# Patient Record
Sex: Male | Born: 1950 | Race: White | Hispanic: No | Marital: Married | Smoking: Never smoker
Health system: Southern US, Community
[De-identification: ages and names within clinical notes are randomized; demographics above are authoritative.]

## PROBLEM LIST (undated history)

## (undated) DIAGNOSIS — E039 Hypothyroidism, unspecified: Secondary | ICD-10-CM

## (undated) DIAGNOSIS — E785 Hyperlipidemia, unspecified: Secondary | ICD-10-CM

## (undated) DIAGNOSIS — C801 Malignant (primary) neoplasm, unspecified: Secondary | ICD-10-CM

## (undated) DIAGNOSIS — I498 Other specified cardiac arrhythmias: Secondary | ICD-10-CM

## (undated) HISTORY — DX: Hyperlipidemia, unspecified: E78.5

## (undated) HISTORY — DX: Other specified cardiac arrhythmias: I49.8

## (undated) HISTORY — PX: KNEE SURGERY: SHX244

## (undated) HISTORY — DX: Hypothyroidism, unspecified: E03.9

## (undated) HISTORY — DX: Malignant (primary) neoplasm, unspecified: C80.1

## (undated) HISTORY — PX: COLONOSCOPY: SHX174

---

## 1998-04-10 ENCOUNTER — Ambulatory Visit (HOSPITAL_COMMUNITY): Admission: RE | Admit: 1998-04-10 | Discharge: 1998-04-10 | Payer: Self-pay | Admitting: Internal Medicine

## 1998-04-10 ENCOUNTER — Encounter: Payer: Self-pay | Admitting: Internal Medicine

## 2000-04-04 HISTORY — PX: KNEE ARTHROSCOPY: SUR90

## 2004-06-14 ENCOUNTER — Ambulatory Visit: Payer: Self-pay | Admitting: Internal Medicine

## 2004-06-24 ENCOUNTER — Ambulatory Visit: Payer: Self-pay | Admitting: Internal Medicine

## 2004-07-12 ENCOUNTER — Ambulatory Visit: Payer: Self-pay | Admitting: Internal Medicine

## 2004-07-12 ENCOUNTER — Encounter (INDEPENDENT_AMBULATORY_CARE_PROVIDER_SITE_OTHER): Payer: Self-pay | Admitting: *Deleted

## 2004-07-12 LAB — CONVERTED CEMR LAB

## 2005-04-13 ENCOUNTER — Ambulatory Visit: Payer: Self-pay | Admitting: Internal Medicine

## 2005-06-13 ENCOUNTER — Ambulatory Visit: Payer: Self-pay | Admitting: Internal Medicine

## 2005-07-20 ENCOUNTER — Ambulatory Visit: Payer: Self-pay | Admitting: Internal Medicine

## 2005-10-07 ENCOUNTER — Ambulatory Visit: Payer: Self-pay | Admitting: Internal Medicine

## 2005-10-10 ENCOUNTER — Ambulatory Visit: Payer: Self-pay | Admitting: Internal Medicine

## 2006-01-10 ENCOUNTER — Ambulatory Visit: Payer: Self-pay | Admitting: Internal Medicine

## 2006-07-11 ENCOUNTER — Ambulatory Visit: Payer: Self-pay | Admitting: Internal Medicine

## 2006-07-11 LAB — CONVERTED CEMR LAB
AST: 21 units/L (ref 0–37)
Albumin: 3.9 g/dL (ref 3.5–5.2)
Basophils Absolute: 0 10*3/uL (ref 0.0–0.1)
Chloride: 108 meq/L (ref 96–112)
Creatinine, Ser: 1.3 mg/dL (ref 0.4–1.5)
Eosinophils Relative: 3.5 % (ref 0.0–5.0)
HCT: 43.6 % (ref 39.0–52.0)
MCHC: 34.3 g/dL (ref 30.0–36.0)
Neutrophils Relative %: 53.4 % (ref 43.0–77.0)
PSA: 3.6 ng/mL (ref 0.10–4.00)
RBC: 4.54 M/uL (ref 4.22–5.81)
RDW: 12.2 % (ref 11.5–14.6)
Sodium: 143 meq/L (ref 135–145)
Total Bilirubin: 1 mg/dL (ref 0.3–1.2)
Total CHOL/HDL Ratio: 4
Triglycerides: 108 mg/dL (ref 0–149)
WBC: 5.2 10*3/uL (ref 4.5–10.5)

## 2006-07-19 ENCOUNTER — Ambulatory Visit: Payer: Self-pay | Admitting: Internal Medicine

## 2006-09-27 ENCOUNTER — Encounter: Payer: Self-pay | Admitting: Internal Medicine

## 2006-10-31 ENCOUNTER — Ambulatory Visit: Payer: Self-pay | Admitting: Internal Medicine

## 2006-11-12 LAB — CONVERTED CEMR LAB
HDL: 55.1 mg/dL (ref >39.0)
Hgb A1c MFr Bld: 5.6 % (ref 4.6–6.0)
Total CHOL/HDL Ratio: 4.2
Triglycerides: 61 mg/dL (ref 0–149)
VLDL: 12 mg/dL (ref 0–40)

## 2006-11-13 ENCOUNTER — Encounter (INDEPENDENT_AMBULATORY_CARE_PROVIDER_SITE_OTHER): Payer: Self-pay | Admitting: *Deleted

## 2007-05-30 ENCOUNTER — Telehealth (INDEPENDENT_AMBULATORY_CARE_PROVIDER_SITE_OTHER): Payer: Self-pay | Admitting: *Deleted

## 2007-07-19 ENCOUNTER — Encounter: Payer: Self-pay | Admitting: Internal Medicine

## 2007-07-27 ENCOUNTER — Ambulatory Visit: Payer: Self-pay | Admitting: Internal Medicine

## 2007-07-31 ENCOUNTER — Encounter: Payer: Self-pay | Admitting: Internal Medicine

## 2007-08-01 ENCOUNTER — Ambulatory Visit: Payer: Self-pay | Admitting: Gastroenterology

## 2007-08-07 ENCOUNTER — Encounter (INDEPENDENT_AMBULATORY_CARE_PROVIDER_SITE_OTHER): Payer: Self-pay | Admitting: *Deleted

## 2007-08-07 DIAGNOSIS — Z9889 Other specified postprocedural states: Secondary | ICD-10-CM

## 2007-08-10 ENCOUNTER — Ambulatory Visit: Payer: Self-pay | Admitting: Internal Medicine

## 2007-08-10 DIAGNOSIS — E785 Hyperlipidemia, unspecified: Secondary | ICD-10-CM

## 2007-08-10 HISTORY — DX: Hyperlipidemia, unspecified: E78.5

## 2007-08-13 ENCOUNTER — Ambulatory Visit: Payer: Self-pay | Admitting: Gastroenterology

## 2007-11-08 ENCOUNTER — Ambulatory Visit: Payer: Self-pay | Admitting: Internal Medicine

## 2007-11-08 LAB — CONVERTED CEMR LAB
Albumin: 4.1 g/dL (ref 3.5–5.2)
Cholesterol: 172 mg/dL (ref 0–200)
HDL: 57.8 mg/dL (ref >39.0)
LDL Cholesterol: 99 mg/dL (ref 0–99)
Total Protein: 6.7 g/dL (ref 6.0–8.3)
Triglycerides: 77 mg/dL (ref 0–149)
VLDL: 15 mg/dL (ref 0–40)

## 2007-11-15 ENCOUNTER — Ambulatory Visit: Payer: Self-pay | Admitting: Internal Medicine

## 2007-11-15 LAB — CONVERTED CEMR LAB
Cholesterol, target level: 200 mg/dL
HDL goal, serum: 50 mg/dL
LDL Goal: 100 mg/dL

## 2007-12-03 ENCOUNTER — Ambulatory Visit: Payer: Self-pay | Admitting: Internal Medicine

## 2007-12-04 ENCOUNTER — Telehealth (INDEPENDENT_AMBULATORY_CARE_PROVIDER_SITE_OTHER): Payer: Self-pay | Admitting: *Deleted

## 2007-12-04 ENCOUNTER — Encounter (INDEPENDENT_AMBULATORY_CARE_PROVIDER_SITE_OTHER): Payer: Self-pay | Admitting: *Deleted

## 2008-03-12 ENCOUNTER — Ambulatory Visit: Payer: Self-pay | Admitting: Internal Medicine

## 2008-03-25 ENCOUNTER — Ambulatory Visit: Payer: Self-pay | Admitting: Internal Medicine

## 2008-03-27 LAB — CONVERTED CEMR LAB
Basophils Absolute: 0 10*3/uL (ref 0.0–0.1)
Lymphocytes Relative: 32 % (ref 12.0–46.0)
MCHC: 34 g/dL (ref 30.0–36.0)
Monocytes Absolute: 0.5 10*3/uL (ref 0.1–1.0)
Monocytes Relative: 9.1 % (ref 3.0–12.0)
Platelets: 195 10*3/uL (ref 150–400)
RDW: 12.5 % (ref 11.5–14.6)
Sed Rate: 6 mm/hr (ref 0–16)

## 2008-03-31 ENCOUNTER — Encounter (INDEPENDENT_AMBULATORY_CARE_PROVIDER_SITE_OTHER): Payer: Self-pay | Admitting: *Deleted

## 2008-04-21 ENCOUNTER — Encounter: Payer: Self-pay | Admitting: Internal Medicine

## 2008-04-24 ENCOUNTER — Encounter: Admission: RE | Admit: 2008-04-24 | Discharge: 2008-04-24 | Payer: Self-pay | Admitting: Otolaryngology

## 2008-04-25 ENCOUNTER — Encounter: Payer: Self-pay | Admitting: Internal Medicine

## 2008-04-25 ENCOUNTER — Other Ambulatory Visit: Admission: RE | Admit: 2008-04-25 | Discharge: 2008-04-25 | Payer: Self-pay | Admitting: Otolaryngology

## 2008-05-05 DIAGNOSIS — C801 Malignant (primary) neoplasm, unspecified: Secondary | ICD-10-CM

## 2008-05-05 HISTORY — DX: Malignant (primary) neoplasm, unspecified: C80.1

## 2008-05-05 HISTORY — PX: TONSILLECTOMY: SUR1361

## 2008-05-06 ENCOUNTER — Encounter: Payer: Self-pay | Admitting: Internal Medicine

## 2008-05-06 ENCOUNTER — Other Ambulatory Visit: Admission: RE | Admit: 2008-05-06 | Discharge: 2008-05-06 | Payer: Self-pay | Admitting: Otolaryngology

## 2008-05-07 ENCOUNTER — Telehealth: Payer: Self-pay | Admitting: Internal Medicine

## 2008-05-20 ENCOUNTER — Telehealth: Payer: Self-pay | Admitting: Internal Medicine

## 2008-05-23 ENCOUNTER — Encounter: Payer: Self-pay | Admitting: Internal Medicine

## 2008-05-26 ENCOUNTER — Ambulatory Visit: Admission: RE | Admit: 2008-05-26 | Discharge: 2008-08-24 | Payer: Self-pay | Admitting: Radiation Oncology

## 2008-05-27 ENCOUNTER — Encounter: Payer: Self-pay | Admitting: Internal Medicine

## 2008-05-27 ENCOUNTER — Telehealth: Payer: Self-pay | Admitting: Internal Medicine

## 2008-05-28 ENCOUNTER — Ambulatory Visit: Payer: Self-pay | Admitting: Oncology

## 2008-05-29 ENCOUNTER — Encounter: Admission: AD | Admit: 2008-05-29 | Discharge: 2008-05-29 | Payer: Self-pay | Admitting: Dentistry

## 2008-05-29 ENCOUNTER — Ambulatory Visit: Payer: Self-pay | Admitting: Dentistry

## 2008-06-04 ENCOUNTER — Encounter: Payer: Self-pay | Admitting: Internal Medicine

## 2008-06-04 ENCOUNTER — Ambulatory Visit (HOSPITAL_COMMUNITY): Admission: RE | Admit: 2008-06-04 | Discharge: 2008-06-04 | Payer: Self-pay | Admitting: Radiation Oncology

## 2008-06-04 LAB — CBC WITH DIFFERENTIAL/PLATELET
BASO%: 0.7 % (ref 0.0–2.0)
Basophils Absolute: 0 10*3/uL (ref 0.0–0.1)
HCT: 39 % (ref 38.4–49.9)
HGB: 13.5 g/dL (ref 13.0–17.1)
MONO#: 0.5 10*3/uL (ref 0.1–0.9)
NEUT%: 61.6 % (ref 39.0–75.0)
RDW: 13.4 % (ref 11.0–14.6)
WBC: 4.9 10*3/uL (ref 4.0–10.3)
lymph#: 1.2 10*3/uL (ref 0.9–3.3)

## 2008-06-04 LAB — COMPREHENSIVE METABOLIC PANEL
ALT: 17 U/L (ref 0–53)
Albumin: 4.6 g/dL (ref 3.5–5.2)
BUN: 16 mg/dL (ref 6–23)
CO2: 25 mEq/L (ref 19–32)
Calcium: 9.3 mg/dL (ref 8.4–10.5)
Chloride: 104 mEq/L (ref 96–112)
Creatinine, Ser: 1.14 mg/dL (ref 0.40–1.50)
Potassium: 4.5 mEq/L (ref 3.5–5.3)

## 2008-06-10 ENCOUNTER — Ambulatory Visit (HOSPITAL_COMMUNITY): Admission: RE | Admit: 2008-06-10 | Discharge: 2008-06-10 | Payer: Self-pay | Admitting: Radiation Oncology

## 2008-06-18 ENCOUNTER — Encounter: Payer: Self-pay | Admitting: Internal Medicine

## 2008-06-24 LAB — BASIC METABOLIC PANEL
CO2: 26 mEq/L (ref 19–32)
Chloride: 93 mEq/L — ABNORMAL LOW (ref 96–112)
Creatinine, Ser: 1.44 mg/dL (ref 0.40–1.50)
Potassium: 4.3 mEq/L (ref 3.5–5.3)
Sodium: 131 mEq/L — ABNORMAL LOW (ref 135–145)

## 2008-06-24 LAB — CBC WITH DIFFERENTIAL/PLATELET
BASO%: 0.3 % (ref 0.0–2.0)
EOS%: 1 % (ref 0.0–7.0)
HCT: 45 % (ref 38.4–49.9)
MCH: 32.2 pg (ref 27.2–33.4)
MCHC: 34 g/dL (ref 32.0–36.0)
MONO#: 0.6 10*3/uL (ref 0.1–0.9)
NEUT%: 77.3 % — ABNORMAL HIGH (ref 39.0–75.0)
RDW: 12.6 % (ref 11.0–14.6)
WBC: 6.4 10*3/uL (ref 4.0–10.3)
lymph#: 0.8 10*3/uL — ABNORMAL LOW (ref 0.9–3.3)

## 2008-06-26 ENCOUNTER — Ambulatory Visit (HOSPITAL_COMMUNITY): Admission: RE | Admit: 2008-06-26 | Discharge: 2008-06-26 | Payer: Self-pay | Admitting: Oncology

## 2008-07-01 LAB — COMPREHENSIVE METABOLIC PANEL
ALT: 18 U/L (ref 0–53)
Alkaline Phosphatase: 60 U/L (ref 39–117)
CO2: 26 mEq/L (ref 19–32)
Creatinine, Ser: 1.33 mg/dL (ref 0.40–1.50)
Glucose, Bld: 88 mg/dL (ref 70–99)
Total Bilirubin: 0.7 mg/dL (ref 0.3–1.2)

## 2008-07-01 LAB — CBC WITH DIFFERENTIAL/PLATELET
BASO%: 0.5 % (ref 0.0–2.0)
HCT: 39.5 % (ref 38.4–49.9)
LYMPH%: 9.4 % — ABNORMAL LOW (ref 14.0–49.0)
MCHC: 34.9 g/dL (ref 32.0–36.0)
MCV: 95.1 fL (ref 79.3–98.0)
MONO#: 0.5 10*3/uL (ref 0.1–0.9)
MONO%: 9 % (ref 0.0–14.0)
NEUT%: 79.8 % — ABNORMAL HIGH (ref 39.0–75.0)
Platelets: 171 10*3/uL (ref 140–400)
WBC: 5.6 10*3/uL (ref 4.0–10.3)

## 2008-07-03 ENCOUNTER — Ambulatory Visit: Payer: Self-pay | Admitting: Oncology

## 2008-07-08 LAB — CBC WITH DIFFERENTIAL/PLATELET
BASO%: 0.6 % (ref 0.0–2.0)
LYMPH%: 14 % (ref 14.0–49.0)
MCH: 33 pg (ref 27.2–33.4)
MCHC: 35 g/dL (ref 32.0–36.0)
MCV: 94.4 fL (ref 79.3–98.0)
MONO%: 17.7 % — ABNORMAL HIGH (ref 0.0–14.0)
Platelets: 155 10*3/uL (ref 140–400)
RBC: 3.92 10*6/uL — ABNORMAL LOW (ref 4.20–5.82)

## 2008-07-08 LAB — COMPREHENSIVE METABOLIC PANEL
Albumin: 3.8 g/dL (ref 3.5–5.2)
Alkaline Phosphatase: 56 U/L (ref 39–117)
BUN: 17 mg/dL (ref 6–23)
Calcium: 9.2 mg/dL (ref 8.4–10.5)
Glucose, Bld: 89 mg/dL (ref 70–99)
Potassium: 4.3 mEq/L (ref 3.5–5.3)

## 2008-07-16 LAB — COMPREHENSIVE METABOLIC PANEL
Albumin: 4.3 g/dL (ref 3.5–5.2)
BUN: 23 mg/dL (ref 6–23)
CO2: 27 mEq/L (ref 19–32)
Calcium: 9.2 mg/dL (ref 8.4–10.5)
Chloride: 99 mEq/L (ref 96–112)
Glucose, Bld: 94 mg/dL (ref 70–99)
Potassium: 4.1 mEq/L (ref 3.5–5.3)
Total Protein: 6.8 g/dL (ref 6.0–8.3)

## 2008-07-16 LAB — CBC WITH DIFFERENTIAL/PLATELET
Basophils Absolute: 0 10*3/uL (ref 0.0–0.1)
Eosinophils Absolute: 0.1 10*3/uL (ref 0.0–0.5)
HGB: 13.4 g/dL (ref 13.0–17.1)
MCV: 94.2 fL (ref 79.3–98.0)
MONO#: 0.6 10*3/uL (ref 0.1–0.9)
NEUT#: 10.2 10*3/uL — ABNORMAL HIGH (ref 1.5–6.5)
RDW: 12.8 % (ref 11.0–14.6)
WBC: 11.3 10*3/uL — ABNORMAL HIGH (ref 4.0–10.3)
lymph#: 0.4 10*3/uL — ABNORMAL LOW (ref 0.9–3.3)

## 2008-07-23 LAB — COMPREHENSIVE METABOLIC PANEL
AST: 15 U/L (ref 0–37)
Albumin: 4.3 g/dL (ref 3.5–5.2)
BUN: 23 mg/dL (ref 6–23)
Calcium: 9.2 mg/dL (ref 8.4–10.5)
Chloride: 104 mEq/L (ref 96–112)
Potassium: 5.1 mEq/L (ref 3.5–5.3)
Sodium: 136 mEq/L (ref 135–145)
Total Protein: 6.9 g/dL (ref 6.0–8.3)

## 2008-07-23 LAB — CBC WITH DIFFERENTIAL/PLATELET
Basophils Absolute: 0 10*3/uL (ref 0.0–0.1)
EOS%: 1.5 % (ref 0.0–7.0)
Eosinophils Absolute: 0.1 10*3/uL (ref 0.0–0.5)
HGB: 12.5 g/dL — ABNORMAL LOW (ref 13.0–17.1)
MCH: 32.8 pg (ref 27.2–33.4)
NEUT#: 5.9 10*3/uL (ref 1.5–6.5)
RBC: 3.81 10*6/uL — ABNORMAL LOW (ref 4.20–5.82)
RDW: 13.1 % (ref 11.0–14.6)
lymph#: 0.4 10*3/uL — ABNORMAL LOW (ref 0.9–3.3)

## 2008-07-30 LAB — CBC WITH DIFFERENTIAL/PLATELET
BASO%: 0.1 % (ref 0.0–2.0)
EOS%: 0.8 % (ref 0.0–7.0)
MCHC: 34.5 g/dL (ref 32.0–36.0)
MONO#: 0.5 10*3/uL (ref 0.1–0.9)
RBC: 3.82 10*6/uL — ABNORMAL LOW (ref 4.20–5.82)
RDW: 13.3 % (ref 11.0–14.6)
WBC: 7.1 10*3/uL (ref 4.0–10.3)
lymph#: 0.4 10*3/uL — ABNORMAL LOW (ref 0.9–3.3)

## 2008-07-30 LAB — COMPREHENSIVE METABOLIC PANEL
ALT: 12 U/L (ref 0–53)
AST: 15 U/L (ref 0–37)
Alkaline Phosphatase: 73 U/L (ref 39–117)
BUN: 24 mg/dL — ABNORMAL HIGH (ref 6–23)
Calcium: 9.6 mg/dL (ref 8.4–10.5)
Chloride: 104 mEq/L (ref 96–112)
Creatinine, Ser: 1.28 mg/dL (ref 0.40–1.50)
Total Bilirubin: 0.3 mg/dL (ref 0.3–1.2)

## 2008-08-25 ENCOUNTER — Ambulatory Visit: Payer: Self-pay | Admitting: Oncology

## 2008-08-27 LAB — CBC WITH DIFFERENTIAL/PLATELET
BASO%: 0.7 % (ref 0.0–2.0)
LYMPH%: 10.6 % — ABNORMAL LOW (ref 14.0–49.0)
MCHC: 35.1 g/dL (ref 32.0–36.0)
MONO#: 0.5 10*3/uL (ref 0.1–0.9)
MONO%: 12.4 % (ref 0.0–14.0)
Platelets: 161 10*3/uL (ref 140–400)
RBC: 3.32 10*6/uL — ABNORMAL LOW (ref 4.20–5.82)
WBC: 4 10*3/uL (ref 4.0–10.3)

## 2008-08-27 LAB — COMPREHENSIVE METABOLIC PANEL
ALT: 14 U/L (ref 0–53)
AST: 15 U/L (ref 0–37)
Alkaline Phosphatase: 58 U/L (ref 39–117)
CO2: 26 mEq/L (ref 19–32)
Sodium: 141 mEq/L (ref 135–145)
Total Bilirubin: 0.4 mg/dL (ref 0.3–1.2)
Total Protein: 6.3 g/dL (ref 6.0–8.3)

## 2008-09-16 ENCOUNTER — Ambulatory Visit: Payer: Self-pay | Admitting: Dentistry

## 2008-09-22 ENCOUNTER — Ambulatory Visit (HOSPITAL_COMMUNITY): Admission: RE | Admit: 2008-09-22 | Discharge: 2008-09-22 | Payer: Self-pay | Admitting: Radiation Oncology

## 2008-09-23 ENCOUNTER — Encounter: Payer: Self-pay | Admitting: Internal Medicine

## 2008-10-31 ENCOUNTER — Ambulatory Visit (HOSPITAL_COMMUNITY): Admission: RE | Admit: 2008-10-31 | Discharge: 2008-10-31 | Payer: Self-pay | Admitting: Oncology

## 2008-11-01 ENCOUNTER — Ambulatory Visit (HOSPITAL_COMMUNITY): Admission: RE | Admit: 2008-11-01 | Discharge: 2008-11-01 | Payer: Self-pay | Admitting: Urology

## 2008-11-03 ENCOUNTER — Ambulatory Visit: Payer: Self-pay | Admitting: Internal Medicine

## 2008-11-03 ENCOUNTER — Ambulatory Visit: Payer: Self-pay | Admitting: Oncology

## 2008-11-05 LAB — COMPREHENSIVE METABOLIC PANEL
ALT: 28 U/L (ref 0–53)
CO2: 28 mEq/L (ref 19–32)
Chloride: 104 mEq/L (ref 96–112)
Potassium: 4.4 mEq/L (ref 3.5–5.3)
Sodium: 139 mEq/L (ref 135–145)
Total Bilirubin: 0.6 mg/dL (ref 0.3–1.2)
Total Protein: 6.4 g/dL (ref 6.0–8.3)

## 2008-11-05 LAB — CBC WITH DIFFERENTIAL/PLATELET
BASO%: 1.5 % (ref 0.0–2.0)
LYMPH%: 17.3 % (ref 14.0–49.0)
MCHC: 35.5 g/dL (ref 32.0–36.0)
MONO#: 0.4 10*3/uL (ref 0.1–0.9)
RBC: 3.87 10*6/uL — ABNORMAL LOW (ref 4.20–5.82)
RDW: 12 % (ref 11.0–14.6)
WBC: 2.6 10*3/uL — ABNORMAL LOW (ref 4.0–10.3)
lymph#: 0.5 10*3/uL — ABNORMAL LOW (ref 0.9–3.3)

## 2009-01-06 ENCOUNTER — Encounter: Payer: Self-pay | Admitting: Internal Medicine

## 2009-01-21 ENCOUNTER — Encounter: Payer: Self-pay | Admitting: Family Medicine

## 2009-01-21 ENCOUNTER — Ambulatory Visit: Payer: Self-pay | Admitting: Family Medicine

## 2009-03-02 ENCOUNTER — Ambulatory Visit: Payer: Self-pay | Admitting: Oncology

## 2009-03-04 LAB — CBC WITH DIFFERENTIAL/PLATELET
BASO%: 0.6 % (ref 0.0–2.0)
EOS%: 2.8 % (ref 0.0–7.0)
HCT: 39.3 % (ref 38.4–49.9)
LYMPH%: 16.1 % (ref 14.0–49.0)
MCH: 34.7 pg — ABNORMAL HIGH (ref 27.2–33.4)
MCHC: 34.2 g/dL (ref 32.0–36.0)
MCV: 101.6 fL — ABNORMAL HIGH (ref 79.3–98.0)
NEUT%: 70.2 % (ref 39.0–75.0)
Platelets: 171 10*3/uL (ref 140–400)

## 2009-03-04 LAB — BASIC METABOLIC PANEL
BUN: 22 mg/dL (ref 6–23)
Calcium: 9.2 mg/dL (ref 8.4–10.5)
Creatinine, Ser: 1.33 mg/dL (ref 0.40–1.50)

## 2009-03-06 ENCOUNTER — Encounter: Payer: Self-pay | Admitting: Internal Medicine

## 2009-03-11 ENCOUNTER — Telehealth: Payer: Self-pay | Admitting: Internal Medicine

## 2009-03-23 ENCOUNTER — Ambulatory Visit: Payer: Self-pay | Admitting: Internal Medicine

## 2009-03-23 DIAGNOSIS — E039 Hypothyroidism, unspecified: Secondary | ICD-10-CM

## 2009-03-23 HISTORY — DX: Hypothyroidism, unspecified: E03.9

## 2009-04-22 ENCOUNTER — Encounter: Payer: Self-pay | Admitting: Internal Medicine

## 2009-04-23 ENCOUNTER — Ambulatory Visit (HOSPITAL_COMMUNITY): Admission: RE | Admit: 2009-04-23 | Discharge: 2009-04-23 | Payer: Self-pay | Admitting: Oncology

## 2009-04-24 ENCOUNTER — Telehealth (INDEPENDENT_AMBULATORY_CARE_PROVIDER_SITE_OTHER): Payer: Self-pay | Admitting: *Deleted

## 2009-04-27 ENCOUNTER — Encounter: Payer: Self-pay | Admitting: Internal Medicine

## 2009-04-29 DIAGNOSIS — I498 Other specified cardiac arrhythmias: Secondary | ICD-10-CM

## 2009-04-29 HISTORY — DX: Other specified cardiac arrhythmias: I49.8

## 2009-04-30 ENCOUNTER — Ambulatory Visit: Payer: Self-pay | Admitting: Internal Medicine

## 2009-05-09 ENCOUNTER — Ambulatory Visit (HOSPITAL_COMMUNITY): Admission: RE | Admit: 2009-05-09 | Discharge: 2009-05-09 | Payer: Self-pay | Admitting: Urology

## 2009-05-11 ENCOUNTER — Encounter: Payer: Self-pay | Admitting: Internal Medicine

## 2009-05-12 ENCOUNTER — Encounter: Payer: Self-pay | Admitting: Internal Medicine

## 2009-05-19 ENCOUNTER — Telehealth: Payer: Self-pay | Admitting: Internal Medicine

## 2009-05-22 ENCOUNTER — Ambulatory Visit: Payer: Self-pay | Admitting: Internal Medicine

## 2009-05-22 LAB — CONVERTED CEMR LAB
BUN: 19 mg/dL (ref 6–23)
Basophils Absolute: 0 10*3/uL (ref 0.0–0.1)
Calcium: 8.4 mg/dL (ref 8.4–10.5)
Chloride: 102 meq/L (ref 96–112)
Creatinine, Ser: 1.28 mg/dL (ref 0.40–1.50)
Eosinophils Relative: 3 % (ref 0–5)
HCT: 35.7 % — ABNORMAL LOW (ref 39.0–52.0)
Hemoglobin: 12.6 g/dL — ABNORMAL LOW (ref 13.0–17.0)
INR: 0.98 (ref <1.50)
Lymphocytes Relative: 17 % (ref 12–46)
Lymphs Abs: 0.6 10*3/uL — ABNORMAL LOW (ref 0.7–4.0)
Neutro Abs: 2.1 10*3/uL (ref 1.7–7.7)
Platelets: 139 10*3/uL — ABNORMAL LOW (ref 150–400)
Prothrombin Time: 12.9 s (ref 11.6–15.2)
WBC: 3.3 10*3/uL — ABNORMAL LOW (ref 4.0–10.5)
aPTT: 32 s (ref 24–37)

## 2009-05-25 ENCOUNTER — Ambulatory Visit: Payer: Self-pay | Admitting: Internal Medicine

## 2009-05-26 ENCOUNTER — Telehealth (INDEPENDENT_AMBULATORY_CARE_PROVIDER_SITE_OTHER): Payer: Self-pay | Admitting: *Deleted

## 2009-05-26 ENCOUNTER — Telehealth: Payer: Self-pay | Admitting: Internal Medicine

## 2009-05-27 ENCOUNTER — Telehealth (INDEPENDENT_AMBULATORY_CARE_PROVIDER_SITE_OTHER): Payer: Self-pay | Admitting: *Deleted

## 2009-05-27 ENCOUNTER — Telehealth: Payer: Self-pay | Admitting: Internal Medicine

## 2009-05-27 LAB — CONVERTED CEMR LAB
Basophils Relative: 0 % (ref 0.0–3.0)
Eosinophils Absolute: 0 10*3/uL (ref 0.0–0.7)
Eosinophils Relative: 0.3 % (ref 0.0–5.0)
Folate: 5.9 ng/mL
Hemoglobin: 13.3 g/dL (ref 13.0–17.0)
Iron: 7 ug/dL — ABNORMAL LOW (ref 42–165)
Lymphocytes Relative: 6.2 % — ABNORMAL LOW (ref 12.0–46.0)
MCHC: 32.8 g/dL (ref 30.0–36.0)
Monocytes Relative: 4 % (ref 3.0–12.0)
Neutro Abs: 10.8 10*3/uL — ABNORMAL HIGH (ref 1.4–7.7)
Neutrophils Relative %: 89.5 % — ABNORMAL HIGH (ref 43.0–77.0)
RBC: 3.99 M/uL — ABNORMAL LOW (ref 4.22–5.81)
Saturation Ratios: 2.7 % — ABNORMAL LOW (ref 20.0–50.0)
Transferrin: 182.6 mg/dL — ABNORMAL LOW (ref 212.0–360.0)
WBC: 12.1 10*3/uL — ABNORMAL HIGH (ref 4.5–10.5)

## 2009-06-02 ENCOUNTER — Telehealth (INDEPENDENT_AMBULATORY_CARE_PROVIDER_SITE_OTHER): Payer: Self-pay | Admitting: *Deleted

## 2009-06-02 HISTORY — PX: OTHER SURGICAL HISTORY: SHX169

## 2009-06-03 ENCOUNTER — Telehealth (INDEPENDENT_AMBULATORY_CARE_PROVIDER_SITE_OTHER): Payer: Self-pay | Admitting: *Deleted

## 2009-06-08 ENCOUNTER — Ambulatory Visit: Payer: Self-pay | Admitting: Internal Medicine

## 2009-06-08 LAB — CONVERTED CEMR LAB
Calcium: 8.9 mg/dL (ref 8.4–10.5)
GFR calc non Af Amer: 60.51 mL/min (ref >60)
Glucose, Bld: 85 mg/dL (ref 70–99)
HCT: 35.8 % — ABNORMAL LOW (ref 39.0–52.0)
Hemoglobin: 12.1 g/dL — ABNORMAL LOW (ref 13.0–17.0)
INR: 1.2 — ABNORMAL HIGH (ref 0.8–1.0)
Platelets: 272 10*3/uL (ref 150.0–400.0)
Potassium: 4.4 meq/L (ref 3.5–5.1)
Prothrombin Time: 12.5 s — ABNORMAL HIGH (ref 9.1–11.7)
RDW: 12.4 % (ref 11.5–14.6)
Sodium: 138 meq/L (ref 135–145)
WBC: 3.2 10*3/uL — ABNORMAL LOW (ref 4.5–10.5)

## 2009-06-12 ENCOUNTER — Ambulatory Visit (HOSPITAL_COMMUNITY): Admission: RE | Admit: 2009-06-12 | Discharge: 2009-06-12 | Payer: Self-pay | Admitting: Internal Medicine

## 2009-06-12 ENCOUNTER — Ambulatory Visit: Payer: Self-pay | Admitting: Internal Medicine

## 2009-06-25 ENCOUNTER — Ambulatory Visit: Payer: Self-pay | Admitting: Internal Medicine

## 2009-06-30 LAB — CONVERTED CEMR LAB: TSH: 3.14 microintl units/mL (ref 0.35–5.50)

## 2009-07-02 ENCOUNTER — Telehealth (INDEPENDENT_AMBULATORY_CARE_PROVIDER_SITE_OTHER): Payer: Self-pay | Admitting: *Deleted

## 2009-07-17 ENCOUNTER — Ambulatory Visit: Payer: Self-pay | Admitting: Internal Medicine

## 2009-07-29 ENCOUNTER — Ambulatory Visit: Payer: Self-pay | Admitting: Internal Medicine

## 2009-07-30 ENCOUNTER — Ambulatory Visit: Payer: Self-pay | Admitting: Oncology

## 2009-07-31 ENCOUNTER — Encounter: Payer: Self-pay | Admitting: Internal Medicine

## 2009-07-31 LAB — CBC WITH DIFFERENTIAL/PLATELET
BASO%: 0.9 % (ref 0.0–2.0)
Basophils Absolute: 0 10*3/uL (ref 0.0–0.1)
EOS%: 2.8 % (ref 0.0–7.0)
HCT: 37.5 % — ABNORMAL LOW (ref 38.4–49.9)
HGB: 12.8 g/dL — ABNORMAL LOW (ref 13.0–17.1)
LYMPH%: 26.5 % (ref 14.0–49.0)
MCH: 32.8 pg (ref 27.2–33.4)
MCHC: 34.1 g/dL (ref 32.0–36.0)
MCV: 96.2 fL (ref 79.3–98.0)
MONO%: 13 % (ref 0.0–14.0)
NEUT%: 56.8 % (ref 39.0–75.0)
lymph#: 0.9 10*3/uL (ref 0.9–3.3)

## 2009-07-31 LAB — COMPREHENSIVE METABOLIC PANEL
ALT: 19 U/L (ref 0–53)
AST: 25 U/L (ref 0–37)
Alkaline Phosphatase: 51 U/L (ref 39–117)
BUN: 18 mg/dL (ref 6–23)
Calcium: 9.2 mg/dL (ref 8.4–10.5)
Chloride: 105 mEq/L (ref 96–112)
Creatinine, Ser: 1.35 mg/dL (ref 0.40–1.50)
Total Bilirubin: 0.9 mg/dL (ref 0.3–1.2)

## 2009-08-20 ENCOUNTER — Ambulatory Visit: Payer: Self-pay | Admitting: Internal Medicine

## 2009-08-21 ENCOUNTER — Telehealth (INDEPENDENT_AMBULATORY_CARE_PROVIDER_SITE_OTHER): Payer: Self-pay | Admitting: *Deleted

## 2009-09-14 ENCOUNTER — Encounter: Payer: Self-pay | Admitting: Internal Medicine

## 2009-09-24 ENCOUNTER — Ambulatory Visit: Payer: Self-pay | Admitting: Internal Medicine

## 2009-09-24 DIAGNOSIS — C090 Malignant neoplasm of tonsillar fossa: Secondary | ICD-10-CM | POA: Insufficient documentation

## 2009-09-24 DIAGNOSIS — R6882 Decreased libido: Secondary | ICD-10-CM

## 2009-09-24 DIAGNOSIS — Z85828 Personal history of other malignant neoplasm of skin: Secondary | ICD-10-CM

## 2009-09-24 LAB — CONVERTED CEMR LAB: LDL Goal: 115 mg/dL

## 2009-09-28 LAB — CONVERTED CEMR LAB
ALT: 20 units/L (ref 0–53)
AST: 22 units/L (ref 0–37)
Albumin: 4.5 g/dL (ref 3.5–5.2)
Basophils Relative: 0.6 % (ref 0.0–3.0)
Chloride: 105 meq/L (ref 96–112)
Eosinophils Relative: 4.7 % (ref 0.0–5.0)
GFR calc non Af Amer: 55.95 mL/min (ref >60)
HCT: 42 % (ref 39.0–52.0)
Hemoglobin: 14.6 g/dL (ref 13.0–17.0)
LDL Cholesterol: 115 mg/dL — ABNORMAL HIGH (ref 0–99)
Lymphs Abs: 0.7 10*3/uL (ref 0.7–4.0)
MCV: 99.7 fL (ref 78.0–100.0)
Monocytes Relative: 13.2 % — ABNORMAL HIGH (ref 3.0–12.0)
Neutro Abs: 1.8 10*3/uL (ref 1.4–7.7)
Potassium: 4.8 meq/L (ref 3.5–5.1)
Sodium: 139 meq/L (ref 135–145)
TSH: 4.78 microintl units/mL (ref 0.35–5.50)
Total Protein: 7 g/dL (ref 6.0–8.3)
VLDL: 12 mg/dL (ref 0.0–40.0)
WBC: 3 10*3/uL — ABNORMAL LOW (ref 4.5–10.5)

## 2009-09-30 ENCOUNTER — Ambulatory Visit: Payer: Self-pay | Admitting: Internal Medicine

## 2009-09-30 ENCOUNTER — Encounter (INDEPENDENT_AMBULATORY_CARE_PROVIDER_SITE_OTHER): Payer: Self-pay | Admitting: *Deleted

## 2009-09-30 LAB — CONVERTED CEMR LAB: OCCULT 2: NEGATIVE

## 2009-10-19 ENCOUNTER — Encounter: Payer: Self-pay | Admitting: Internal Medicine

## 2009-11-09 ENCOUNTER — Ambulatory Visit: Payer: Self-pay | Admitting: Oncology

## 2009-11-25 ENCOUNTER — Ambulatory Visit: Payer: Self-pay | Admitting: Internal Medicine

## 2009-11-26 LAB — COMPREHENSIVE METABOLIC PANEL
ALT: 19 U/L (ref 0–53)
AST: 23 U/L (ref 0–37)
Albumin: 4.4 g/dL (ref 3.5–5.2)
CO2: 28 mEq/L (ref 19–32)
Calcium: 9.3 mg/dL (ref 8.4–10.5)
Chloride: 106 mEq/L (ref 96–112)
Creatinine, Ser: 1.41 mg/dL (ref 0.40–1.50)
Potassium: 4.1 mEq/L (ref 3.5–5.3)

## 2009-11-26 LAB — CBC WITH DIFFERENTIAL/PLATELET
BASO%: 1.4 % (ref 0.0–2.0)
Basophils Absolute: 0.1 10*3/uL (ref 0.0–0.1)
EOS%: 2.5 % (ref 0.0–7.0)
HCT: 39.4 % (ref 38.4–49.9)
HGB: 13.9 g/dL (ref 13.0–17.1)
MCH: 33.5 pg — ABNORMAL HIGH (ref 27.2–33.4)
MCHC: 35.3 g/dL (ref 32.0–36.0)
MONO#: 0.5 10*3/uL (ref 0.1–0.9)
NEUT%: 54 % (ref 39.0–75.0)
RDW: 12.5 % (ref 11.0–14.6)
WBC: 3.6 10*3/uL — ABNORMAL LOW (ref 4.0–10.3)
lymph#: 1 10*3/uL (ref 0.9–3.3)

## 2009-11-27 ENCOUNTER — Encounter: Payer: Self-pay | Admitting: Internal Medicine

## 2009-12-08 ENCOUNTER — Telehealth (INDEPENDENT_AMBULATORY_CARE_PROVIDER_SITE_OTHER): Payer: Self-pay | Admitting: *Deleted

## 2010-01-05 ENCOUNTER — Telehealth (INDEPENDENT_AMBULATORY_CARE_PROVIDER_SITE_OTHER): Payer: Self-pay | Admitting: *Deleted

## 2010-02-01 ENCOUNTER — Ambulatory Visit: Payer: Self-pay | Admitting: Oncology

## 2010-02-02 ENCOUNTER — Ambulatory Visit: Payer: Self-pay | Admitting: Internal Medicine

## 2010-02-03 ENCOUNTER — Encounter: Payer: Self-pay | Admitting: Internal Medicine

## 2010-02-03 LAB — COMPREHENSIVE METABOLIC PANEL
Albumin: 4.5 g/dL (ref 3.5–5.2)
Alkaline Phosphatase: 41 U/L (ref 39–117)
BUN: 25 mg/dL — ABNORMAL HIGH (ref 6–23)
Calcium: 9.3 mg/dL (ref 8.4–10.5)
Chloride: 104 mEq/L (ref 96–112)
Creatinine, Ser: 1.41 mg/dL (ref 0.40–1.50)
Glucose, Bld: 84 mg/dL (ref 70–99)
Potassium: 4.4 mEq/L (ref 3.5–5.3)

## 2010-02-03 LAB — CBC WITH DIFFERENTIAL/PLATELET
Basophils Absolute: 0 10*3/uL (ref 0.0–0.1)
EOS%: 2.6 % (ref 0.0–7.0)
Eosinophils Absolute: 0.1 10*3/uL (ref 0.0–0.5)
HCT: 39.6 % (ref 38.4–49.9)
HGB: 13.7 g/dL (ref 13.0–17.1)
MCH: 34.3 pg — ABNORMAL HIGH (ref 27.2–33.4)
MCV: 98.7 fL — ABNORMAL HIGH (ref 79.3–98.0)
MONO%: 12.2 % (ref 0.0–14.0)
NEUT#: 2.2 10*3/uL (ref 1.5–6.5)
NEUT%: 63 % (ref 39.0–75.0)
Platelets: 174 10*3/uL (ref 140–400)
RDW: 12.7 % (ref 11.0–14.6)

## 2010-02-15 LAB — CONVERTED CEMR LAB: TSH: 2.91 microintl units/mL (ref 0.35–5.50)

## 2010-04-06 ENCOUNTER — Ambulatory Visit
Admission: RE | Admit: 2010-04-06 | Discharge: 2010-04-06 | Payer: Self-pay | Source: Home / Self Care | Attending: Internal Medicine | Admitting: Internal Medicine

## 2010-04-06 DIAGNOSIS — J019 Acute sinusitis, unspecified: Secondary | ICD-10-CM | POA: Insufficient documentation

## 2010-04-15 ENCOUNTER — Ambulatory Visit
Admission: RE | Admit: 2010-04-15 | Discharge: 2010-04-15 | Payer: Self-pay | Source: Home / Self Care | Attending: Internal Medicine | Admitting: Internal Medicine

## 2010-04-15 ENCOUNTER — Other Ambulatory Visit: Payer: Self-pay | Admitting: Internal Medicine

## 2010-04-15 DIAGNOSIS — R079 Chest pain, unspecified: Secondary | ICD-10-CM | POA: Insufficient documentation

## 2010-04-15 LAB — CBC WITH DIFFERENTIAL/PLATELET
Basophils Absolute: 0 10*3/uL (ref 0.0–0.1)
Basophils Relative: 0.7 % (ref 0.0–3.0)
Eosinophils Absolute: 0.1 10*3/uL (ref 0.0–0.7)
Eosinophils Relative: 2 % (ref 0.0–5.0)
HCT: 41.8 % (ref 39.0–52.0)
Hemoglobin: 14.3 g/dL (ref 13.0–17.0)
Lymphocytes Relative: 17.7 % (ref 12.0–46.0)
Lymphs Abs: 1 10*3/uL (ref 0.7–4.0)
MCHC: 34.1 g/dL (ref 30.0–36.0)
MCV: 99 fl (ref 78.0–100.0)
Monocytes Absolute: 0.6 10*3/uL (ref 0.1–1.0)
Monocytes Relative: 10.1 % (ref 3.0–12.0)
Neutro Abs: 3.8 10*3/uL (ref 1.4–7.7)
Neutrophils Relative %: 69.5 % (ref 43.0–77.0)
Platelets: 242 10*3/uL (ref 150.0–400.0)
RBC: 4.22 Mil/uL (ref 4.22–5.81)
RDW: 12.4 % (ref 11.5–14.6)
WBC: 5.5 10*3/uL (ref 4.5–10.5)

## 2010-04-15 LAB — SEDIMENTATION RATE: Sed Rate: 10 mm/hr (ref 0–22)

## 2010-04-24 ENCOUNTER — Encounter: Payer: Self-pay | Admitting: Oncology

## 2010-04-25 ENCOUNTER — Encounter: Payer: Self-pay | Admitting: Oncology

## 2010-04-26 ENCOUNTER — Ambulatory Visit
Admission: RE | Admit: 2010-04-26 | Discharge: 2010-04-26 | Payer: Self-pay | Source: Home / Self Care | Attending: Internal Medicine | Admitting: Internal Medicine

## 2010-04-26 DIAGNOSIS — M542 Cervicalgia: Secondary | ICD-10-CM | POA: Insufficient documentation

## 2010-05-04 NOTE — Assessment & Plan Note (Signed)
Summary: eph/jml   Primary Provider:  Alwyn Ren  CC:  eph/ablation.  Marland Kitchen  History of Present Illness: Mr. Bohr is seen following catheter ablation. He has had no recurrent sustained tachycardia palpitations. He has had some triggering sensations.  Current Medications (verified): 1)  Basa 81mg  Qd 2)  Fish Oil 300 Mg Caps (Omega-3 Fatty Acids) .Marland Kitchen.. 1 By Mouth Once Daily 3)  Lipitor 20 Mg  Tabs (Atorvastatin Calcium) .... 1/2 Pill By Mouth M,w,f 4)  Synthroid 75 Mcg Tabs (Levothyroxine Sodium) .Marland Kitchen.. 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 CHF ; no FH lymphoma or NHL  Social History: Last updated: 03/25/2008 Never Smoked Alcohol use-yes OCCASIONAL Married Regular exercise-yes  Vital Signs:  Patient profile:   60 year old male Height:      70 inches Weight:      165 pounds BMI:     23.76 Pulse rate:   47 / minute Pulse rhythm:   regular BP sitting:   140 / 82  (left arm) Cuff size:   large  Vitals Entered By: Judithe Modest CMA (July 17, 2009 12:13 PM)  Physical Exam  General:  The patient was alert and oriented in no acute distress. HEENT Normal.  Neck veins were flat, carotids were brisk.  Lungs were clear.  Heart sounds were regular without murmurs or gallops.  Abdomen was soft with active bowel sounds. There is no clubbing cyanosis or  edema. Skin Warm and dry    Impression & Recommendations:  Problem # 1:  SUPRAVENTRICULAR TACHYCARDIA (ICD-427.89) the patient status post tachycardia ablation. He had no recurrent symptoms. We'll see him again as needed Orders: EKG w/ Interpretation (93000)  Patient Instructions: 1)  Your physician recommends that you schedule a follow-up appointment in: as needed.

## 2010-05-04 NOTE — Progress Notes (Signed)
Summary:  Procedure 05/29/09  Phone Note Outgoing Call   Call placed by: Duncan Dull, RN, BSN,  May 26, 2009 12:51 PM Call placed to: Specialist Summary of Call: Called and left message for Dr. Lodema Pilot RN to call me in ref to Mr. [facility] labs. Duncan Dull, RN, BSN  May 26, 2009 12:51 PM   Follow-up for Phone Call        PT SAW PCP AND LABS WERE ADDRESSED. Follow-up by: Duncan Dull, RN, BSN,  May 27, 2009 10:33 AM

## 2010-05-04 NOTE — Letter (Signed)
Summary: [facility]  [facility]   Imported By: Lanelle Bal 02/16/2010 12:18:46  _____________________________________________________________________  External Attachment:    Type:   Image     Comment:   External Document

## 2010-05-04 NOTE — Progress Notes (Signed)
Summary: night sweat  Phone Note Call from Patient   Caller: Patient Summary of Call: Pt left VM stating that at OV on yesterday he was ask if he had experienced any night sweat and he said no. Pt now states that last night he did experience night sweat and just remember that he did have some episode as well in December. Pt would like to know if this changes anything in regards to his treatment for SINUSITIS, and bronchitis or for his health in general pls advise..................Marland KitchenFelecia Deloach CMA  May 26, 2009 10:33 AM   Follow-up for Phone Call        no , please complete full course of antibiotics. Lab results still pending Follow-up by: Marga Melnick MD,  May 26, 2009 12:10 PM  Additional Follow-up for Phone Call Additional follow up Details #1::        pt aware.................Marland KitchenFelecia Deloach CMA  May 26, 2009 12:16 PM

## 2010-05-04 NOTE — Progress Notes (Signed)
Summary: Triage Call  Phone Note Call from Patient Call back at Home Phone [phone]   Caller: Patient Summary of Call: Patient called to say last night he inhaled gum and it went down the wrong way and he thinks its in his left lung. Patient is in no kind of distress but would like to know if he should be seen or if there is anything he needs to look out for and then be seen.   Dr.Hopper please advise and forward to [facility] (Triage Nurse).Shonna Chock  May 19, 2009 8:23 AM   Follow-up for Phone Call        If he inhaled the gum he should develop cough, fever & possibly wheezing.In event of these CXray would be indicated. Gum itself would not show up on Xray but related PNA would. Usually the epiglottis is 100% effective in preventing aspiration Follow-up by: Marga Melnick MD,  May 19, 2009 8:56 AM  Additional Follow-up for Phone Call Additional follow up Details #1::        pt aware..................Marland KitchenFelecia Deloach CMA  May 19, 2009 9:06 AM

## 2010-05-04 NOTE — Progress Notes (Signed)
Summary: TSH Concerns  Phone Note Call from Patient Call back at Home Phone [phone]   Caller: Patient Summary of Call: pt left VM on chrae VM  that he had recent labs done for thyroid and that we should receive result soon and would like a call with what he needs to do. call pt back left message to call...........Marland KitchenFelecia Deloach CMA  April 24, 2009 10:35 AM   TSH 9.430, goal = 1-3. I agree 75 micrograms once daily with repeat TSH after 8 weeks appropriate(244.9)   Follow-up for Phone Call        Message left on VM informing patient of Dr.Hopper's response in Ref: TSH Follow-up by: Shonna Chock,  April 24, 2009 11:50 AM    New/Updated Medications: SYNTHROID 75 MCG TABS (LEVOTHYROXINE SODIUM) 1 by mouth once daily  Appended Document: TSH Concerns    Phone Note Call from Patient   Caller: Patient Summary of Call: pt called back stating that he is currently on 50 micrograms and wants to know if meds can be broken in half to equal the 75 micrograms. left message to call office to inform pt he would need new rx and to verify pharmacy.................Marland KitchenFelecia Deloach CMA  April 24, 2009 3:07 PM    Follow-up for Phone Call        pt aware rx sent to pharmacy, labs schedule.............Marland KitchenFelecia Deloach CMA  April 24, 2009 3:47 PM     Prescriptions: SYNTHROID 75 MCG TABS (LEVOTHYROXINE SODIUM) 1 by mouth once daily  #30 x 1   Entered by:   Jeremy Johann CMA   Authorized by:   Marga Melnick MD   Signed by:   Jeremy Johann CMA on 04/24/2009   Method used:   Electronically to        [id number]       [address]       [place], [place]  [address]       Ph: [phone] or [phone]       Fax: [phone]   RxID:   [id number]

## 2010-05-04 NOTE — Consult Note (Signed)
Summary: [facility]  [facility]   Imported By: Lanelle Bal 10/29/2009 12:33:10  _____________________________________________________________________  External Attachment:    Type:   Image     Comment:   External Document

## 2010-05-04 NOTE — Progress Notes (Signed)
Summary: med concerns  Phone Note Refill Request Message from:  Patient  Refills Requested: Medication #1:  SYNTHROID 75 MCG TABS 1 by mouth once daily EXCEPT 1&1/2 T pt left vm that with new direction on med he is running out before the end of the month. pt also states that he needs to schedule f/u lab appt.........Marland KitchenFelecia Deloach CMA  December 08, 2009 3:21 PM  pt aware rx sent to pharmacy, appt schedule..............Marland KitchenFelecia Deloach CMA  December 08, 2009 3:24 PM      Prescriptions: SYNTHROID 75 MCG TABS (LEVOTHYROXINE SODIUM) 1 by mouth once daily EXCEPT 1&1/2 T , Th & Sat(TSH 3.92)  #36 x 2   Entered by:   Jeremy Johann CMA   Authorized by:   Marga Melnick MD   Signed by:   Jeremy Johann CMA on 12/08/2009   Method used:   Faxed to ...       [id number] (ret[facility]       [address]       [place], [place]  [address]       Ph: [phone] or [phone]       Fax: [phone]   RxID:   [id number]

## 2010-05-04 NOTE — Progress Notes (Signed)
Summary: LIPITOR REFILL  Phone Note Refill Request Call back at Home Phone [phone] Message from:  Patient on January 05, 2010 12:34 PM  Refills Requested: Medication #1:  LIPITOR 20 MG  TABS take as directed [facility] [address]  QTY=30    HE SAYS DIRECTIONS NEED TO BE "TAKE AS DIRECTED"  Initial call taken by: Jerolyn Shin,  January 05, 2010 12:35 PM    Prescriptions: LIPITOR 20 MG  TABS (ATORVASTATIN CALCIUM) take as directed  #30 x 7   Entered by:   Shonna Chock CMA   Authorized by:   Marga Melnick MD   Signed by:   Shonna Chock CMA on 01/05/2010   Method used:   Electronically to        [id number]       [address]       [place], [place]  [address]       Ph: [phone] or [phone]       Fax: [phone]   RxID:   [id number]

## 2010-05-04 NOTE — Letter (Signed)
Summary: ELectrophysiology/Ablation Procedure Instructions  [facility], Main Office  [address]   [place], [place] [address]   Phone: [phone]  Fax: [phone]     SVT Ablation Procedure Instructions    You are scheduled for a(n) ABLATION on FEB. 25, 2011 at 1000 with Dr. Graciela Husbands.  1.  Please come to the Short Stay Center at [facility] at 0800 on the day of your procedure.  2.  Come prepared to stay overnight.   Please bring your insurance cards and a list of your medications.  3.  Come to the [place] office on 05/22/09 for lab work.  The lab at [facility] is open from 8:30 AM to 1:30 PM and 2:30 PM to 5:00 PM.  The lab at [facility] is open from 7:30 AM to 5:30 PM.  You do not have to be fasting.  4.  Do not have anything to eat or drink after midnight the night before your procedure.  5. You may take your medications  with a small amount of water.  6.  Educational material received:  _____ EP   __x___ Ablation   * Occasionally, EP studies and ablations can become lengthy.  Please make your family aware of this before your procedure starts.  Average time ranges from 2-8 hours for EP studies/ablations.  Your physician will locate your family after the procedure with the results.  * If you have any questions after you get home, please call the office at [phone].

## 2010-05-04 NOTE — Progress Notes (Signed)
Summary: Lab Results  Phone Note Outgoing Call Call back at Home Phone [phone]   Call placed by: Shonna Chock,  May 27, 2009 8:02 AM Call placed to: Patient Summary of Call: Spoke with patient  No anemia or decreased platelet count , but white count elevated with pattern of cells suggesting presence of a bacterial infection.I'll forward to Cardiology to see if they wish to reschedule procedure until infection resolved. Hopp  **Per Dr.Hopper: already sent message to Cardiology**   [facility]  May 27, 2009 8:02 AM

## 2010-05-04 NOTE — Letter (Signed)
Summary: [facility]  [facility]   Imported By: Lanelle Bal 11/30/2009 13:06:57  _____________________________________________________________________  External Attachment:    Type:   Image     Comment:   External Document

## 2010-05-04 NOTE — Assessment & Plan Note (Signed)
Summary: ?shingle//fd   Vital Signs:  Patient profile:   60 year old male Weight:      167 pounds Temp:     98.4 degrees F oral Pulse rate:   64 / minute Resp:     14 per minute BP sitting:   116 / 74  (left arm) Cuff size:   large  Vitals Entered By: Shonna Chock (July 29, 2009 2:49 PM) CC: Rash on right side of chest (? Shingles) Comments REVIEWED MED LIST, PATIENT AGREED DOSE AND INSTRUCTION CORRECT    Primary Care Provider:  Alwyn Ren  CC:  Rash on right side of chest (? Shingles).  History of Present Illness: He noted a rash RU chest with intermittent itching. He had shingles X 2 last year 3 & 6 months after radiation of tonsilar malignancy. TSH reviewed; it was @ goal @ 3.41, down from > 9.  Allergies: 1)  Sulfa  Review of Systems General:  Complains of sweats; denies chills, fever, and weight loss; Occasional nightsweats. Derm:  Complains of changes in color of skin and rash.  Physical Exam  General:  well-nourished,in no acute distress; alert,appropriate and cooperative throughout examination Neck:  No deformities, masses, or tenderness noted. Skin:  4X2 cm raised rash R chest w/o vesicles; Dermatographia present Cervical Nodes:  No lymphadenopathy noted Axillary Nodes:  No palpable lymphadenopathy   Impression & Recommendations:  Problem # 1:  RASH-NONVESICULAR (ICD-782.1) ? T 2 Zoster  Complete Medication List: 1)  Basa 81mg  Qd  2)  Fish Oil 300 Mg Caps (Omega-3 fatty acids) .Marland Kitchen.. 1 by mouth once daily 3)  Lipitor 20 Mg Tabs (Atorvastatin calcium) .... 1/2 pill by mouth m,w,f 4)  Synthroid 75 Mcg Tabs (Levothyroxine sodium) .Marland Kitchen.. 1 by mouth once daily 5)  Famciclovir 500 Mg Tabs (Famciclovir) .Marland Kitchen.. 1 three times a day 6)  Hydroxyzine Pamoate 25 Mg Caps (Hydroxyzine pamoate) .Marland Kitchen.. 1-2 every 4-6 hrs as needed itching  Patient Instructions: 1)  Please report fever, rash extension & pain Prescriptions: HYDROXYZINE PAMOATE 25 MG CAPS (HYDROXYZINE PAMOATE) 1-2  every 4-6 hrs as needed itching  #30 x 0   Entered and Authorized by:   Marga Melnick MD   Signed by:   Marga Melnick MD on 07/29/2009   Method used:   Faxed to ...       [id number]       [address]       [place], [place]  [address]       Ph: [phone] or [phone]       Fax: [phone]   RxID:   [id number] FAMCICLOVIR 500 MG TABS (FAMCICLOVIR) 1 three times a day  #21 x 0   Entered and Authorized by:   Marga Melnick MD   Signed by:   Marga Melnick MD on 07/29/2009   Method used:   Faxed to ...       [id number]       [address]       [place], [place]  [address]       Ph: [phone] or [phone]       Fax: [phone]   RxID:   [id number]

## 2010-05-04 NOTE — Progress Notes (Signed)
Summary: still no better  Phone Note Call from Patient Call back at Home Phone [phone]   Caller: Patient Summary of Call: Pt left VM that he was seen on 05-27-08 for sinus infection. Pt still having chills, bodyaches and low grade fever of 99.5. pt states that cough is much better. pt has only 1 day left of AMOXICILLIN-POT. [facility] [address] Pt would like to know if there is something else he needs to do. pls advise............Marland KitchenFelecia Deloach CMA  June 02, 2009 9:29 AM   Follow-up for Phone Call        Per dr hopper METRONIDAZOLE 500 MG  1 tab tid avoid  no alcohol on this med. if no better repeat cbc w/diff and do CT of sinuses and chest x-ray.Marland KitchenMarland KitchenFelecia Deloach CMA  June 02, 2009 10:51 AM   pt aware...........Marland KitchenFelecia Deloach CMA  June 02, 2009 10:59 AM     New/Updated Medications: METRONIDAZOLE 500 MG TABS (METRONIDAZOLE) Take 1 tab three times a day **avoid   alcohol** Prescriptions: METRONIDAZOLE 500 MG TABS (METRONIDAZOLE) Take 1 tab three times a day **avoid   alcohol**  #21 x 0   Entered by:   Jeremy Johann CMA   Authorized by:   Marga Melnick MD   Signed by:   Jeremy Johann CMA on 06/02/2009   Method used:   Faxed to ...       [id number]       [address]       [place], [place]  [address]       Ph: [phone] or [phone]       Fax: [phone]   RxID:   [id number]

## 2010-05-04 NOTE — Progress Notes (Signed)
Summary: diarrhea  Phone Note Call from Patient   Caller: Patient Summary of Call: Pt called back stating that he has been experienced a couple episodes of diarrhea prior and since starting METRONIDAZOLE 500 MG. pt denies any fever, nausea, or vomiting. Pt advise to make sure med is not taking med on empty stomach,  drinking plenty of fluids, avoiding any fatty/greasy food, and dairy product until stomach issues has resolved. pt advise to call office if fever,vomiting or severe diarrhea occurs, pt ok..t...............Marland KitchenFelecia Deloach CMA  June 03, 2009 12:19 PM

## 2010-05-04 NOTE — Letter (Signed)
[facility]   Imported By: Lanelle Bal 08/17/2009 13:29:43  _____________________________________________________________________  External Attachment:    Type:   Image     Comment:   External Document

## 2010-05-04 NOTE — Progress Notes (Signed)
Summary: procedure  Phone Note Call from Patient Call back at Home Phone [phone]   Caller: Patient Reason for Call: Talk to Nurse Summary of Call: calling to see if he can still have his procedure on friday... pt saw PCP, has bronchitis, lab results in emr Initial call taken by: Migdalia Dk,  May 27, 2009 8:18 AM  Follow-up for Phone Call        s/w Dr. Graciela Husbands and the pt and we are rescheduling his procedure d/t his bronchitis and his lab work. His labs were addressed with his PCP yesterday. We will plan to repeat those before his next procedure date.  Follow-up by: Duncan Dull, RN, BSN,  May 27, 2009 10:05 AM

## 2010-05-04 NOTE — Consult Note (Signed)
Summary: [facility]  [facility]   Imported By: Marylou Mccoy 05/21/2009 13:34:30  _____________________________________________________________________  External Attachment:    Type:   Image     Comment:   External Document

## 2010-05-04 NOTE — Letter (Signed)
Summary: Results Follow up Letter  [place] [facility] [place]  [address] [place], [place] [address]   Phone: [phone]  Fax: [phone]    09/30/2009 MRN: [id number]  [facility] [address] [place], [place]  [address]  Dear Juan Ford,  The following are the results of your recent test(s):  Test         Result    Pap Smear:        Normal _____  Not Normal _____ Comments: ______________________________________________________ Cholesterol: LDL(Bad cholesterol):         Your goal is less than:         HDL (Good cholesterol):       Your goal is more than: Comments:  ______________________________________________________ Mammogram:        Normal _____  Not Normal _____ Comments:  ___________________________________________________________________ Hemoccult:        Normal _x___  Not normal _______ Comments:    _____________________________________________________________________ Other Tests:    We routinely do not discuss normal results over the telephone.  If you desire a copy of the results, or you have any questions about this information we can discuss them at your next office visit.   Sincerely,

## 2010-05-04 NOTE — Procedures (Signed)
Summary: [facility]   [facility]   Imported By: Roderic Ovens 07/17/2009 13:19:06  _____________________________________________________________________  External Attachment:    Type:   Image     Comment:   External Document

## 2010-05-04 NOTE — Progress Notes (Signed)
Summary: Refill request  Phone Note Refill Request Message from:  Patient  Refills Requested: Medication #1:  SYNTHROID 75 MCG TABS 1 by mouth once daily [facility] [address]   Method Requested: Electronic Initial call taken by: Shonna Chock,  July 02, 2009 9:33 AM    Prescriptions: SYNTHROID 75 MCG TABS (LEVOTHYROXINE SODIUM) 1 by mouth once daily  #30 x 3   Entered by:   Shonna Chock   Authorized by:   Marga Melnick MD   Signed by:   Shonna Chock on 07/02/2009   Method used:   Electronically to        [facility]       [address]       [place], [place]  [address]       Ph: [phone] or [phone]       Fax: [phone]   RxID:   [id number]

## 2010-05-04 NOTE — Assessment & Plan Note (Signed)
Summary: cpx/ns/kdc   Vital Signs:  Patient profile:   60 year old male Height:      70.25 inches Weight:      162.8 pounds Temp:     98.1 degrees F oral Pulse rate:   56 / minute Resp:     14 per minute BP sitting:   118 / 70  (left arm) Cuff size:   large  Vitals Entered By: Shonna Chock (September 24, 2009 8:45 AM)  CC: Lipid Management Comments REVIEWED MED LIST, PATIENT AGREED DOSE AND INSTRUCTION CORRECT    Primary Care Provider:  Alwyn Ren  CC:  Lipid Management.  History of Present Illness: Mr. Juan Ford is here for a physical; he is asymptomatic since a catheter ablation in 06/2009..  Lipid Management History:      Positive NCEP/ATP III risk factors include male age 43 years old or older.  Negative NCEP/ATP III risk factors include non-diabetic, no family history for ischemic heart disease, non-tobacco-user status, non-hypertensive, no ASHD (atherosclerotic heart disease), no prior stroke/TIA, no peripheral vascular disease, and no history of aortic aneurysm.     Allergies: 1)  Sulfa  Past History:  Past Medical History: HYPERLIPIDEMIA NEC/NOS (ICD-272.4): Framingham Study LDL goal = < 160. NMR Lipoprofile: LDL 142(1668/860), HDL 64, TG 59. LDL goal = < 115. 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 Skin cancer, hx of, Dr Donzetta Starch  Past Surgical History: ARTHROSCOPY, KNEE, HX OF (ICD-V45.89) 2002 colonoscopy negative 08/2002; 2009 Knee surgery , L 1971 ;  R knee 1977 Catheter radiofrequency  ablation 06/2009 , Dr Berton Mount; Leodis Sias , Primary Cardiologist  Family History: Mother: breast cancer, diabetes, colon polyps, tics ; Father: esophageal  cancer (smoker), CHF ;sister: Lymphoma  Social History: Never Smoked Alcohol use-yes  socially Married Regular exercise-yes:tennis, golf,biking for 6 hrs / week  Occupation:Operations Manager  Review of Systems General:  Denies chills, fatigue, fever, sleep disorder, sweats, and weight loss. Eyes:  Denies blurring, double vision, and vision loss-both eyes. ENT:  Denies difficulty swallowing and hoarseness. CV:  Denies chest pain or discomfort, difficulty breathing at night, difficulty breathing while lying down, fainting, fatigue, leg cramps with exertion, lightheadness, near fainting, palpitations, shortness of breath with exertion, swelling of feet, and swelling of hands. Resp:  Denies coughing up blood, shortness of breath, and sputum productive. GI:  Denies abdominal pain, bloody stools, dark tarry stools, and indigestion. GU:  Complains of decreased libido; denies discharge, dysuria, and hematuria; Nocturia 1-2X/ night. MS:  Denies joint pain, joint redness, joint swelling, low back pain, mid back pain, muscle weakness, and thoracic pain. Derm:  Denies changes in nail beds, dryness, hair loss, lesion(s), and rash. Neuro:  Denies headaches, numbness, and tingling. Psych:  Denies anxiety and depression. Endo:  Denies cold intolerance, excessive hunger, excessive thirst, excessive urination, and heat intolerance. Heme:  Denies abnormal bruising and bleeding. Allergy:  Denies itching eyes and sneezing.  Physical Exam  General:  Thin but well-nourished; alert,appropriate and cooperative throughout examination Head:  Normocephalic and atraumatic without obvious abnormalities. No apparent alopecia  Eyes:  No corneal or conjunctival inflammation noted. Perrla. Funduscopic exam benign, without hemorrhages, exudates or papilledema.  Ears:  External ear exam shows no significant lesions or deformities.  Otoscopic examination reveals clear canals, tympanic membranes are intact bilaterally without bulging,  retraction, inflammation or discharge. Hearing is grossly normal  bilaterally. Nose:  External nasal examination shows no deformity or inflammation. Nasal mucosa are pink and moist without lesions or exudates. Mouth:  Oral mucosa and oropharynx without lesions or exudates.  Teeth in good repair. Neck:  No deformities, masses, or tenderness noted. Lungs:  Normal respiratory effort, chest expands symmetrically. Lungs are clear to auscultation, no crackles or wheezes. Heart:  regular rhythm, no murmur, no gallop, no rub, no JVD, no HJR, and bradycardia. Rare  faint click @ apex w/o regurgitation Abdomen:  Bowel sounds positive,abdomen soft and non-tender without masses, organomegaly or hernias noted. Rectal:  No external abnormalities noted. Normal sphincter tone. No rectal masses or tenderness. Genitalia:  Testes bilaterally descended without nodularity, tenderness or masses. No scrotal masses or lesions. No penis lesions or urethral discharge. Prostate:  Prostate gland firm and smooth, no enlargement, nodularity, tenderness, mass, asymmetry or induration. Msk:  No deformity or scoliosis noted of thoracic or lumbar spine.   Pulses:  R and L carotid,radial,dorsalis pedis and posterior tibial pulses are full and equal bilaterally Extremities:  No clubbing, cyanosis, edema, or deformity noted. Decreased   range of motion of  knees Neurologic:  alert & oriented X3, gait normal, and DTRs symmetrical and normal.   Skin:  Intact without suspicious lesions or rashes. Minor erythema @ tip of nose Cervical Nodes:  No lymphadenopathy noted Axillary Nodes:  No palpable lymphadenopathy Inguinal Nodes:  No significant adenopathy Psych:  memory intact for recent and remote, normally interactive, and good eye contact.     Impression & Recommendations:  Problem # 1:  ROUTINE GENERAL MEDICAL EXAM@HEALTH  CARE FACL (ICD-V70.0)  Orders: Venipuncture (40981) TLB-Lipid Panel (80061-LIPID) TLB-BMP (Basic Metabolic Panel-BMET) (80048-METABOL) TLB-CBC Platelet - w/Differential  (85025-CBCD) TLB-Hepatic/Liver Function Pnl (80076-HEPATIC) TLB-TSH (Thyroid Stimulating Hormone) (84443-TSH) TLB-PSA (Prostate Specific Antigen) (84153-PSA) TLB-Testosterone, Total (84403-TESTO)  Problem # 2:  HYPERLIPIDEMIA (ICD-272.4)  His updated medication list for this problem includes:    Lipitor 20 Mg Tabs (Atorvastatin calcium) .Marland Kitchen... Take as directed  Problem # 3:  HYPOTHYROIDISM (ICD-244.9)  His updated medication list for this problem includes:    Synthroid 75 Mcg Tabs (Levothyroxine sodium) .Marland Kitchen... 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 1 by mouth once daily 3)  Lipitor 20 Mg Tabs (Atorvastatin calcium) .... Take as directed 4)  Synthroid 75 Mcg Tabs (Levothyroxine sodium) .Marland Kitchen.. 1 by mouth once daily except 1&1/2 t & th  Lipid Assessment/Plan:      Based on NCEP/ATP III, the patient's risk factor category is "0-1 risk factors".  The patient's lipid goals are as follows: Total cholesterol goal is 200; LDL cholesterol goal is 115; HDL cholesterol goal is 50; Triglyceride goal is 150.  His LDL cholesterol goal has been met.  Secondary causes for hyperlipidemia have been ruled out.  He has been counseled on adjunctive measures for lowering his cholesterol and has been provided with dietary instructions.    Patient Instructions: 1)  TSH done to assess response  to increased thyroid dose.   Immunization  History:  Tetanus/Td Immunization History:    Tetanus/Td:  historical (03/04/2006)   Appended Document: cpx/ns/kdc  Laboratory Results   Urine Tests   Date/Time Reported: September 24, 2009 9:59 AM   Routine Urinalysis   Color: yellow Appearance: Clear Glucose: negative   (Normal Range: Negative) Bilirubin: negative   (Normal Range: Negative) Ketone: negative   (Normal Range: Negative) Spec. Gravity: 1.015   (Normal Range: 1.003-1.035) Blood: negative   (Normal Range: Negative) pH: 6.0   (Normal Range: 5.0-8.0) Protein: negative   (Normal Range: Negative) Urobilinogen: negative   (Normal Range: 0-1) Nitrite: negative   (Normal Range: Negative) Leukocyte Esterace: negative   (Normal Range: Negative)    Comments: Floydene Flock  September 24, 2009 9:59 AM

## 2010-05-04 NOTE — Letter (Signed)
[facility]   Imported By: Lanelle Bal 05/29/2009 13:36:10  _____________________________________________________________________  External Attachment:    Type:   Image     Comment:   External Document

## 2010-05-04 NOTE — Assessment & Plan Note (Signed)
Summary: Juan Ford   Primary Provider:  Alwyn Ren  CC:  new patient with palpitations.  pt reports this has been happening since childhood.  Marland Kitchen  History of Present Illness: Juan Ford is seen at the request of Dr. Elease Hashimoto because of recurrent tachypalpitations recently documented as supraventricular tachycardia.  The patient is a 60 year old gentleman with a history of tachycardia palpitations that are abrupt in onset and offset that go back to when he was about 7. These episodes have become increasingly frequent over recent years they occur one to 3 times every 1-6 months. They're mostly occurring at night waking him from sleep. They can last from 30 seconds up to 4 hours. They are frog positive and not associated with shortness of breath or chest pain. They're also diuretic negative.  he does not note any association with stimulants or stress.  He comes in today requesting catheter ablation for definitive therapy for his recurrent symptoms  He has noted a decreased intensity of these episodes over the last year. This occurs in the wake of a diagnosis of throat cancer for which he underwent tonsillectomy radiation therapy and chemotherapy. The latter 2 issues  have been quite complicate radiation by problems with swallowing and dry mouth and the latter by tinnitus and clicking sensations in his ears.  Current Medications (verified): 1)  Basa 81mg  Qd 2)  Fish Oil 300 Mg Caps (Omega-3 Fatty Acids) .Marland Kitchen.. 1 By Mouth Once Daily 3)  Lipitor 20 Mg  Tabs (Atorvastatin Calcium) .... 1/2 Pill By Mouth M,w,f 4)  Synthroid 75 Mcg Tabs (Levothyroxine Sodium) .Marland Kitchen.. 1 By Mouth Once Daily  Allergies (verified): 1)  Sulfa  Past History:  Past Surgical History: Last updated: 11/15/2007 ARTHROSCOPY, KNEE, HX OF (ICD-V45.89) 2002 colonoscopy negative 08/2002; 2009 Knee surgery 1971 L; 1977 R   Family History: Last updated: 03/25/2008 mother breast cancer, diabetes, colon polyps, tics father  esophageal  cancer ,. CHF ; no FH lymphoma or NHL  Social History: Last updated: 03/25/2008 Never Smoked Alcohol use-yes OCCASIONAL Married Regular exercise-yes  Past Medical History: HYPERLIPIDEMIA NEC/NOS (ICD-272.4) intermittent click @  apex (no fixed prolapse) Meningitis with coma  age 76 , no sequellae Hypothyroidism Hx SVT palpitations Hx tonsillar cancer-s/p radiation therapy and chemotherapy Hearing loss andn tinnitus following chemotherapy  Review of Systems       full review of systems was negative apart from a history of present illness and past medical history.   Vital Signs:  Patient profile:   60 year old male Height:      70 inches Weight:      167 pounds BMI:     24.05 Pulse rate:   51 / minute Pulse rhythm:   regular BP sitting:   140 / 96  (left arm) Cuff size:   large  Vitals Entered By: Judithe Modest CMA (April 30, 2009 10:37 AM)  Physical Exam  General:  Well developed, well nourished, in no acute distress. Head:  HEENT normal Neck:  Neck supple, no JVD. No masses, thyromegaly or abnormal cervical nodes. Lungs:  Clear bilaterally to auscultation and percussion. Heart:  rhythm was regular and normal rate there wereer no murmurs or gallops Abdomen:  Bowel sounds positive; abdomen soft and non-tender without masses, organomegaly, or hernias noted. No hepatoomegaly. Msk:  Back normal, normal gait. Muscle strength and tone normal. Pulses:  pulses normal in all 4 extremities Extremities:  No clubbing or cyanosis. Neurologic:  Alert and oriented x 3. as the normal Skin:  some atrophy  in the suprasternal notch Cervical Nodes:  negative Psych:  Normal affect.   EKG  Procedure date:  04/30/2009  Findings:      sinus rhythm at 51 Intervals 0.15/0.09/0.45 Axis LXXX otherwise normal  Impression & Recommendations:  Problem # 1:  SUPRAVENTRICULAR TACHYCARDIA (ICD-427.89)  The patient has documented SVT the oneset of which is not clear as to  whetehr or not there is PR prolongation or not.  We have discussed teh physiology of SVT and why it is that his symptoms may becoming more frequent and persistnet. We discussed treatment options including catheteher ablation with potential benefits and risks including butnot limited to death, perforation andheart block requiring pacing.  They understand agree and are willing to proceed.   We will need to get a copy of his echo   Orders: EKG w/ Interpretation (93000)  Patient Instructions: 1)  Your physician has recommended that you have an ablation.  Catheter ablation is a medical procedure used to treat some cardiac arrhythmias (irregular heartbeats). During catheter ablation, a long, thin, flexible tube is put into a blood vessel in your groin (upper thigh), or neck. This tube is called an ablation catheter. It is then guided to your heart through the blood vessel. Radiofrequency waves destroy small areas of heart tissue where abnormal heartbeats may cause an arrhythmia to start.  Please see the instruction sheet given to you today. 2)  Your physician recommends that you return for lab work on: 05/22/09 (BMET, CBC, PT, PTT)

## 2010-05-04 NOTE — Letter (Signed)
Summary: [facility]  [facility]   Imported By: Lanelle Bal 01/19/2010 15:53:39  _____________________________________________________________________  External Attachment:    Type:   Image     Comment:   External Document

## 2010-05-04 NOTE — Progress Notes (Signed)
Summary: refill-  Phone Note Refill Request   Refills Requested: Medication #1:  LIPITOR 20 MG  TABS 1/2 PILL by mouth M [facility].Marland KitchenPT AWARE RX SENT TO PHARMACY AND LABS DUE..................Marland KitchenFelecia Deloach CMA  Aug 21, 2009 12:36 PM      New/Updated Medications: LIPITOR 20 MG  TABS (ATORVASTATIN CALCIUM) take as directed Prescriptions: LIPITOR 20 MG  TABS (ATORVASTATIN CALCIUM) take as directed  #30 x 0   Entered by:   Jeremy Johann CMA   Authorized by:   Marga Melnick MD   Signed by:   Jeremy Johann CMA on 08/21/2009   Method used:   Faxed to ...       [id number] (ret[facility]       [address]       [place], [place]  [address]       Ph: [phone] or [phone]       Fax: [phone]   RxID:   [id number]

## 2010-05-04 NOTE — Assessment & Plan Note (Signed)
Summary: SINUS INFECTION?/KDC   Vital Signs:  Patient profile:   60 year old male Weight:      166 pounds Temp:     99.0 degrees F oral Pulse rate:   72 / minute Resp:     12 per minute BP sitting:   110 / 68  (left arm) Cuff size:   large  Vitals Entered By: Shonna Chock (May 25, 2009 1:52 PM) CC: ? Sinus Infection off/on x 4-5 days. Discolored congestion and drainage, chills and overall not feeling well Comments REVIEWED MED LIST, PATIENT AGREED DOSE AND INSTRUCTION CORRECT    Primary Care Provider:  Alwyn Ren  CC:  ? Sinus Infection off/on x 4-5 days. Discolored congestion and drainage and chills and overall not feeling well.  History of Present Illness: Onset last week as headache congestion with dark green  PNDrainage last week intermittently 05/22/2009.Chills 2 am today followed by dark sputum. Rx: Neti pot, Mucinex, hydration. CBC & dif 05/22/2009 revealed mild PCP.  Allergies: 1)  Sulfa  Review of Systems General:  Denies fever and sweats. ENT:  Complains of sinus pressure; No frontal headache or facial pain. Resp:  Complains of sputum productive; denies shortness of breath and wheezing.  Physical Exam  General:  in no acute distress; alert,appropriate and cooperative throughout examination Ears:  External ear exam shows no significant lesions or deformities.  Otoscopic examination reveals clear canals, tympanic membranes are intact bilaterally without bulging, retraction, inflammation or discharge. Hearing is grossly normal bilaterally. Nose:  External nasal examination shows no deformity or inflammation. Nasal mucosa are pink and moist without lesions or exudates. Mouth:  Oral mucosa and oropharynx without lesions or exudates.  Teeth in good repair. Lungs:  Normal respiratory effort, chest expands symmetrically. Lungs are clear to auscultation, no crackles or wheezes. Cervical Nodes:  No lymphadenopathy noted Axillary Nodes:  No palpable  lymphadenopathy   Impression & Recommendations:  Problem # 1:  BRONCHITIS-ACUTE (ICD-466.0)  His updated medication list for this problem includes:    Amoxicillin-pot Clavulanate 875-125 Mg Tabs (Amoxicillin-pot clavulanate) .Marland Kitchen... 1 q 12 hrs with a meal  Problem # 2:  SINUSITIS- ACUTE-NOS (ICD-461.9)  His updated medication list for this problem includes:    Amoxicillin-pot Clavulanate 875-125 Mg Tabs (Amoxicillin-pot clavulanate) .Marland Kitchen... 1 q 12 hrs with a meal  Problem # 3:  PANCYTOPENIA (ICD-284.1)  Orders: Venipuncture (16109) TLB-CBC Platelet - w/Differential (85025-CBCD) TLB-B12 + Folate Pnl (60454_09811-B14/NWG) TLB-IBC Pnl (Iron/FE;Transferrin) (83550-IBC)  Complete Medication List: 1)  Basa 81mg  Qd  2)  Fish Oil 300 Mg Caps (Omega-3 fatty acids) .Marland Kitchen.. 1 by mouth once daily 3)  Lipitor 20 Mg Tabs (Atorvastatin calcium) .... 1/2 pill by mouth m,w,f 4)  Synthroid 75 Mcg Tabs (Levothyroxine sodium) .Marland Kitchen.. 1 by mouth once daily 5)  Amoxicillin-pot Clavulanate 875-125 Mg Tabs (Amoxicillin-pot clavulanate) .Marland Kitchen.. 1 q 12 hrs with a meal  Patient Instructions: 1)  Drink as much fluid as you can tolerate for the next few days. Prescriptions: AMOXICILLIN-POT CLAVULANATE 875-125 MG TABS (AMOXICILLIN-POT CLAVULANATE) 1 q 12 hrs with a meal  #20 x 0   Entered and Authorized by:   Marga Melnick MD   Signed by:   Marga Melnick MD on 05/25/2009   Method used:   Faxed to ...       [id number]       [address]       [place], [place]  [address]       Ph: [phone] or [phone]  Fax: [phone]   RxID:   [id number]

## 2010-05-04 NOTE — Progress Notes (Signed)
Summary: blood count  Phone Note Call from Patient Call back at Home Phone [phone]   Caller: Patient Reason for Call: Talk to Nurse Summary of Call: pt states he blood counts are about the same they have been..... is this going to be ok for procedure or will he need a shot? Initial call taken by: Migdalia Dk,  May 27, 2009 10:16 AM  Follow-up for Phone Call        Per Dr. Graciela Husbands, if this is not new for the pt we will move forward.  Called patient and left message on machine  Duncan Dull, RN, BSN  May 27, 2009 10:42 AM

## 2010-05-06 NOTE — Assessment & Plan Note (Signed)
Summary: pain in neck//lch   Vital Signs:  Patient profile:   60 year old male Weight:      170.2 pounds BMI:     24.34 Temp:     98.3 degrees F oral Pulse rate:   60 / minute Resp:     12 per minute BP sitting:   128 / 80  (left arm) Cuff size:   large  Vitals Entered By: Shonna Chock CMA (April 26, 2010 3:42 PM) CC: Pain/tenderness in neck , Neck pain, URI symptoms   Primary Care Provider:  Alwyn Ren  CC:  Pain/tenderness in neck , Neck pain, and URI symptoms.  History of Present Illness:    Onset as intense dull pain" 8+ "  below R mandibular angle 01/20; it has improved  with NSAIDS & is now a residual tenderness with 2-3 level pain .  Associated symptoms include impaired neck ROM,ie  extension  , ? related to tightening of skin in area of  prior Radiation.  The patient denies the following associated symptoms: numbness, weakness, impaired coordination, tingling/parasthesias, fever, bladder dysfunction, bowel dysfunction, locking, and clicking.  The patient denies nasal congestion, purulent nasal discharge, sore throat, and earache.  The patient denies fever, frontal  headache & bilateral facial pain, tooth pain, and tender adenopathy.    Current Medications (verified): 1)  Basa 81mg  Qd 2)  Fish Oil 300 Mg Caps (Omega-3 Fatty Acids) .Marland Kitchen.. 1 By Mouth Once Daily 3)  Lipitor 20 Mg  Tabs (Atorvastatin Calcium) .... Take As Directed 4)  Synthroid 75 Mcg Tabs (Levothyroxine Sodium) .Marland Kitchen.. 1 By Mouth Once Daily Except 1&1/2 T , Th & Sat(Tsh 3.92)  Allergies: 1)  Sulfa  Physical Exam  General:  well-nourished,in no acute distress; alert,appropriate and cooperative throughout examination Ears:  External ear exam shows no significant lesions or deformities.  Otoscopic examination reveals clear canals, tympanic membranes are intact bilaterally without bulging, retraction, inflammation or discharge. Hearing is grossly normal bilaterally. Nose:  External nasal examination shows no deformity  or inflammation. Nasal mucosa are pink and moist without lesions or exudates. Mouth:  Oral mucosa and oropharynx without lesions or exudates.  Teeth in good repair. Neck:  No deformities or masses. Localized  tenderness noted 1/3 way down anterior to  R SCM muscle. The muscle itself is no tender Cervical Nodes:  No lymphadenopathy noted Axillary Nodes:  No palpable lymphadenopathy   Impression & Recommendations:  Problem # 1:  NECK PAIN, RIGHT (ICD-723.1) Improving with NSAIDS ;no palpable LA  Complete Medication List: 1)  Basa 81mg  Qd  2)  Fish Oil 300 Mg Caps (Omega-3 fatty acids) .Marland Kitchen.. 1 by mouth once daily 3)  Lipitor 20 Mg Tabs (Atorvastatin calcium) .... Take as directed 4)  Synthroid 75 Mcg Tabs (Levothyroxine sodium) .Marland Kitchen.. 1 by mouth once daily except 1&1/2 t , th & sat(tsh 3.92)  Patient Instructions: 1)  Use Zostrix cream OR a heating pad up to three times a day to the tender area .  2)  Take 400-600mg  of Ibuprofen (Advil, Motrin) with food every 4-6 hours as needed for relief of pain .   Orders Added: 1)  Est. Patient Level III [04540]

## 2010-05-06 NOTE — Assessment & Plan Note (Signed)
Summary: sore spot to touch at lower sternum area--related to last wee...   Vital Signs:  Patient profile:   60 year old male Height:      70.25 inches (178.44 cm) Weight:      169.13 pounds (76.88 kg) BMI:     24.18 Temp:     99.4 degrees F (37.44 degrees C) oral Resp:     14 per minute BP sitting:   120 / 82  (left arm) Cuff size:   regular  Vitals Entered By: Lucious Groves CMA (April 15, 2010 12:30 PM) CC: C/O sore spot in epigastric area x1 week./kb, URI symptoms Is Patient Diabetic? No Comments Patient notes that it is sore to the touch and he has had slight fever. He denies chest pain, SOB, dizziness, blurry vision, cough, and gerd symptoms/increased belching.   Primary Care Provider:  Alwyn Ren  CC:  C/O sore spot in epigastric area x1 week./kb and URI symptoms.  History of Present Illness:      This is a 60 year old man who presents with  low grade fever since RTI. The patient now  denies nasal congestion, clear nasal discharge, sore throat, productive cough, and earache.  The temp is <100.5 degrees. The patient denies stiff neck, dyspnea, and wheezing.  The patient denies headache.  The patient denies the following risk factors for Strep sinusitis: blateral facial pain, tooth pain, and tender adenopathy.   He has had soreness over inferior chest X 7 days.  Current Medications (verified): 1)  Basa 81mg  Qd 2)  Fish Oil 300 Mg Caps (Omega-3 Fatty Acids) .Marland Kitchen.. 1 By Mouth Once Daily 3)  Lipitor 20 Mg  Tabs (Atorvastatin Calcium) .... Take As Directed 4)  Synthroid 75 Mcg Tabs (Levothyroxine Sodium) .Marland Kitchen.. 1 By Mouth Once Daily Except 1&1/2 T , Th & Sat(Tsh 3.92) 5)  Amoxicillin 500 Mg Caps (Amoxicillin) .Marland Kitchen.. 1 Three Times A Day 6)  Fluticasone Propionate 50 Mcg/act Susp (Fluticasone Propionate) .Marland Kitchen.. 1 Spray Two Times A Day As Needed  Allergies (verified): 1)  Sulfa  Review of Systems General:  Denies chills and sweats. Eyes:  Denies discharge. Resp:  Denies sputum  productive. GI:  Denies diarrhea. GU:  Denies discharge, dysuria, and hematuria. Derm:  Denies lesion(s) and rash.  Physical Exam  General:  well-nourished,in no acute distress; alert,appropriate and cooperative throughout examination Ears:  External ear exam shows no significant lesions or deformities.  Otoscopic examination reveals clear canals, tympanic membranes are intact bilaterally without bulging, retraction, inflammation or discharge. Hearing is grossly normal bilaterally. Nose:  External nasal examination shows no deformity or inflammation. Nasal mucosa are pink and moist without lesions or exudates. Mouth:  Oral mucosa and oropharynx without lesions or exudates.  Teeth in good repair. Neck:  No deformities, masses, or tenderness noted. Supple Lungs:  Normal respiratory effort, chest expands symmetrically. Lungs are clear to auscultation, no crackles or wheezes. Heart:  Normal rate and regular rhythm. S1 and S2 normal without gallop, murmur, click, rub . S4 Abdomen:  Bowel sounds positive,abdomen soft and non-tender without masses, organomegaly or hernias noted. Skin:  Intact without suspicious lesions or rashes Cervical Nodes:  No lymphadenopathy noted Axillary Nodes:  No palpable lymphadenopathy   Impression & Recommendations:  Problem # 1:  CHEST PAIN (ICD-786.50)  Xiphoid tenderness  Orders: Venipuncture ([address]) TLB-Sedimentation Rate (ESR) (85652-ESR)  Problem # 2:  FEVER (ICD-780.60)  low grade w/o localizing signs  Orders: Venipuncture (60454) TLB-CBC Platelet - w/Differential (85025-CBCD) TLB-Sedimentation Rate (ESR) (85652-ESR)  Complete Medication List: 1)  Basa 81mg  Qd  2)  Fish Oil 300 Mg Caps (Omega-3 fatty acids) .Marland Kitchen.. 1 by mouth once daily 3)  Lipitor 20 Mg Tabs (Atorvastatin calcium) .... Take as directed 4)  Synthroid 75 Mcg Tabs (Levothyroxine sodium) .Marland Kitchen.. 1 by mouth once daily except 1&1/2 t , th & sat(tsh 3.92) 5)  Amoxicillin 500 Mg Caps  (Amoxicillin) .Marland Kitchen.. 1 three times a day 6)  Fluticasone Propionate 50 Mcg/act Susp (Fluticasone propionate) .Marland Kitchen.. 1 spray two times a day as needed  Patient Instructions: 1)  Glucosamine sulfate 1500 mg once daily until Xiphoid tenderness resolves.Keep Temp Diary as discussed. Heating pad OR Zoastrix cream three times a day as needed .   Orders Added: 1)  Est. Patient Level III [62130] 2)  Venipuncture [86578] 3)  TLB-CBC Platelet - w/Differential [85025-CBCD] 4)  TLB-Sedimentation Rate (ESR) [85652-ESR]  Appended Document: sore spot to touch at lower sternum area--related to last wee.Marland KitchenMarland Kitchen

## 2010-05-06 NOTE — Assessment & Plan Note (Signed)
Summary: sinus infection//lch   Vital Signs:  Patient profile:   60 year old male Weight:      171.8 pounds BMI:     24.56 Temp:     98.5 degrees F oral Pulse rate:   64 / minute Resp:     12 per minute BP sitting:   132 / 80  (left arm) Cuff size:   large  Vitals Entered By: Shonna Chock CMA (April 06, 2010 4:17 PM) CC: 1.) Sinus Infection x 2 weeks   2.) Examine left hand and feet, URI symptoms   Primary Care Provider:  Alwyn Ren  CC:  1.) Sinus Infection x 2 weeks   2.) Examine left hand and feet and URI symptoms.  History of Present Illness:      This is a 60 year old man who presents with URI symptoms ; onset as congestion 01/01.  The patient  now reports persistent  nasal congestion,  green purulent nasal discharge, and L  earache, but denies productive cough.  The patient denies fever, dyspnea, and wheezing.  The patient denies headache.  The patient denies the following risk factors for Strep sinusitis: unilateral facial pain, tooth pain, and tender adenopathy. Rx: Mucinex  Current Medications (verified): 1)  Basa 81mg  Qd 2)  Fish Oil 300 Mg Caps (Omega-3 Fatty Acids) .Marland Kitchen.. 1 By Mouth Once Daily 3)  Lipitor 20 Mg  Tabs (Atorvastatin Calcium) .... Take As Directed 4)  Synthroid 75 Mcg Tabs (Levothyroxine Sodium) .Marland Kitchen.. 1 By Mouth Once Daily Except 1&1/2 T , Th & Sat(Tsh 3.92)  Allergies: 1)  Sulfa  Physical Exam  General:  in no acute distress; alert,appropriate and cooperative throughout examination Ears:  R ear normal and L TM erythema.   Nose:  External nasal examination shows no deformity or inflammation. Nasal mucosa are  dry  without lesions or exudates. Mouth:  Oral mucosa and oropharynx without lesions or exudates.  Teeth in good repair. Lungs:  Normal respiratory effort, chest expands symmetrically. Lungs are clear to auscultation, no crackles or wheezes. Heart:  bradycardia.   Split S1 w/o murmur Cervical Nodes:  L posterior LA Axillary Nodes:  No palpable  lymphadenopathy   Impression & Recommendations:  Problem # 1:  SINUSITIS- ACUTE-NOS (ICD-461.9)  His updated medication list for this problem includes:    Amoxicillin 500 Mg Caps (Amoxicillin) .Marland Kitchen... 1 three times a day    Fluticasone Propionate 50 Mcg/act Susp (Fluticasone propionate) .Marland Kitchen... 1 spray two times a day as needed  Complete Medication List: 1)  Basa 81mg  Qd  2)  Fish Oil 300 Mg Caps (Omega-3 fatty acids) .Marland Kitchen.. 1 by mouth once daily 3)  Lipitor 20 Mg Tabs (Atorvastatin calcium) .... Take as directed 4)  Synthroid 75 Mcg Tabs (Levothyroxine sodium) .Marland Kitchen.. 1 by mouth once daily except 1&1/2 t , th & sat(tsh 3.92) 5)  Amoxicillin 500 Mg Caps (Amoxicillin) .Marland Kitchen.. 1 three times a day 6)  Fluticasone Propionate 50 Mcg/act Susp (Fluticasone propionate) .Marland Kitchen.. 1 spray two times a day as needed  Patient Instructions: 1)  Neti pot once daily two times a day as needed for sinus congestion. 2)  Drink as much  NON dairy fluid as you can tolerate for the next few days. Prescriptions: FLUTICASONE PROPIONATE 50 MCG/ACT SUSP (FLUTICASONE PROPIONATE) 1 spray two times a day as needed  #1 x 5   Entered and Authorized by:   Marga Melnick MD   Signed by:   Marga Melnick MD on 04/06/2010  Method used:   Electronically to        [id number] (retail)       [address]       [place], [place]  [address]       Ph: [phone] or [phone]       Fax: [phone]   RxID:   [id number] AMOXICILLIN 500 MG CAPS (AMOXICILLIN) 1 three times a day  #30 x 0   Entered and Authorized by:   Marga Melnick MD   Signed by:   Marga Melnick MD on 04/06/2010   Method used:   Electronically to        [id number] (retail)       [address]       [place], [place]  [address]       Ph: [phone] or [phone]       Fax: [phone]   RxID:   [id number]    Orders Added: 1)  Est. Patient Level III [57322]  Appended Document: sinus infection//lch Dupuytren's contracture L palm ;  referred to Web MD

## 2010-05-21 ENCOUNTER — Ambulatory Visit (HOSPITAL_COMMUNITY)
Admission: RE | Admit: 2010-05-21 | Discharge: 2010-05-21 | Disposition: A | Discharge status: Home / Self Care | Payer: BC Managed Care – PPO | Source: Ambulatory Visit | Attending: Oncology | Admitting: Oncology

## 2010-05-21 ENCOUNTER — Other Ambulatory Visit: Payer: Self-pay | Admitting: Oncology

## 2010-05-21 ENCOUNTER — Encounter (HOSPITAL_BASED_OUTPATIENT_CLINIC_OR_DEPARTMENT_OTHER): Payer: BC Managed Care – PPO | Admitting: Oncology

## 2010-05-21 DIAGNOSIS — C140 Malignant neoplasm of pharynx, unspecified: Secondary | ICD-10-CM

## 2010-05-21 DIAGNOSIS — R599 Enlarged lymph nodes, unspecified: Secondary | ICD-10-CM | POA: Insufficient documentation

## 2010-05-21 DIAGNOSIS — Z09 Encounter for follow-up examination after completed treatment for conditions other than malignant neoplasm: Secondary | ICD-10-CM | POA: Insufficient documentation

## 2010-05-21 DIAGNOSIS — C099 Malignant neoplasm of tonsil, unspecified: Secondary | ICD-10-CM

## 2010-05-21 LAB — BASIC METABOLIC PANEL
Calcium: 9.4 mg/dL (ref 8.4–10.5)
Creatinine, Ser: 1.52 mg/dL — ABNORMAL HIGH (ref 0.40–1.50)
Sodium: 140 mEq/L (ref 135–145)

## 2010-05-21 MED ORDER — IOHEXOL 300 MG/ML  SOLN
50.0000 mL | Freq: Once | INTRAMUSCULAR | Status: AC | PRN
  Administered 2010-05-21: 50 mL via INTRAVENOUS

## 2010-05-28 ENCOUNTER — Ambulatory Visit: Payer: BC Managed Care – PPO | Attending: Radiation Oncology | Admitting: Radiation Oncology

## 2010-05-28 ENCOUNTER — Encounter: Payer: Self-pay | Admitting: Internal Medicine

## 2010-06-15 NOTE — Letter (Signed)
Summary: [facility]  [facility]   Imported By: Maryln Gottron 06/08/2010 15:52:34  _____________________________________________________________________  External Attachment:    Type:   Image     Comment:   External Document

## 2010-07-11 LAB — CREATININE, SERUM
Creatinine, Ser: 1.5 mg/dL (ref 0.4–1.5)
GFR calc Af Amer: 59 mL/min — ABNORMAL LOW (ref >60)
GFR calc non Af Amer: 48 mL/min — ABNORMAL LOW (ref >60)

## 2010-08-04 IMAGING — CR DG CHEST 2V
1 series · 1 of 1 positions shown · non-contrast
Comparison: [DATE]

CLINICAL DATA: Cough.  Smoking history.

CHEST - 2 VIEW

[view not recorded]
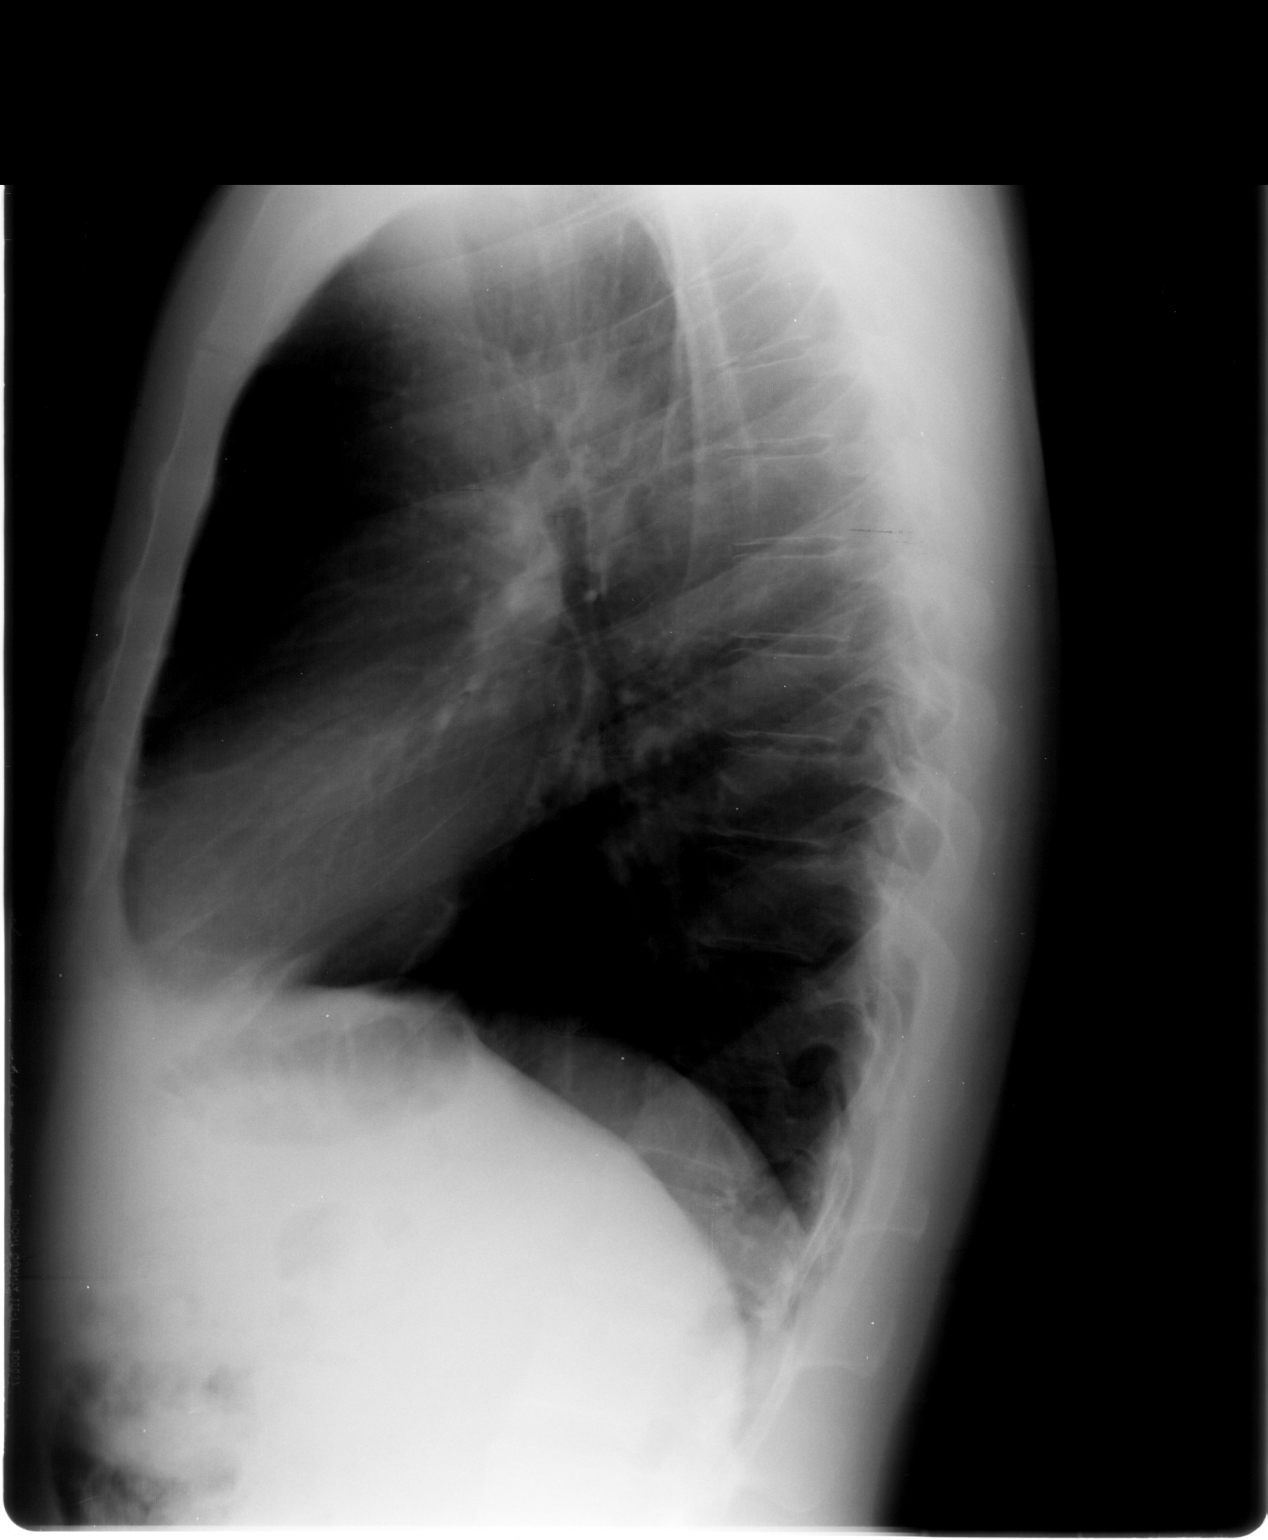

[1 of 1 positions shown; findings below may reference images not displayed]

FINDINGS: Heart size is normal.  The mediastinum is unremarkable.
Left lung is clear.  I think there may be a minimal infiltrate in
the right lower lobe.  No dense consolidation, collapse or
effusion.
IMPRESSION: Minimal infiltrate right lower lobe.

## 2010-08-16 ENCOUNTER — Other Ambulatory Visit (INDEPENDENT_AMBULATORY_CARE_PROVIDER_SITE_OTHER): Payer: BC Managed Care – PPO

## 2010-08-16 DIAGNOSIS — E039 Hypothyroidism, unspecified: Secondary | ICD-10-CM

## 2010-08-18 ENCOUNTER — Telehealth: Payer: Self-pay | Admitting: Internal Medicine

## 2010-08-18 MED ORDER — LEVOTHYROXINE SODIUM 75 MCG PO TABS: 75.0000 ug | ORAL_TABLET | ORAL | Status: DC

## 2010-08-18 NOTE — Telephone Encounter (Signed)
RX sent to pharmacy  

## 2010-08-18 NOTE — Telephone Encounter (Signed)
Pt is calling about his synthroid med. He had labs done on yesterday and no one call him about them and now he needs a refill on his synthroid to be call in to the [facility] [address]

## 2010-08-20 NOTE — Assessment & Plan Note (Signed)
[facility]                        [facility] OFFICE NOTE   Juan, Ford                           MRN:          [id number]  DATE:07/19/2006                            DOB:          December 23, 1950    COMPREHENSIVE PHYSICAL EXAMINATION   HISTORY:  The patient is essentially asymptomatic except for occasional  palpitations.  This has been a longstanding problem which he has noted  since at least [age] years of age.  He had some symptoms in high school  but noted a decrease in the frequency in his 20's.  Previously they had  lasted less than 30 seconds.  They always occur at rest and are not  associated with his exercise program and are not stress related.   He provided records from August, 2005 until April, 2008, which suggest  that the duration has diminished greatly.  In August of 2005 one episode  lasted 95 minutes and in December he noted symptoms over an hour.  Subsequent to that in 2006 and 2007, the duration was never more than 45  minutes. During 2008, the maximum duration has been a few minutes.  As  noted, they occur at rest, typically while reading or lying supine.   He will play tennis two to three times per week and jogs occasionally  with no cardiopulmonary symptoms including palpitations.   His symptoms were evaluated with a stress test previously which was  negative.   PAST MEDICAL HISTORY:  Reveals arthroscopy in 2002, bilateral knee  surgeries.  Negative colonoscopy in 2004.  He has had an intermittent  click at the apex suggestive of mitral valve prolapse.  At no time has  he exhibited mitral regurgitation or dysrhythmia's.  SBE prophylaxis has  never been indicated clinically.   FAMILY HISTORY:  There is no stroke or heart attack in the family.  His  mother had breast cancer, diabetes, colon polyps, diverticulosis.  His  father had esophageal and stomach cancer, congestive heart failure.   SOCIAL HISTORY:  He has never  smoked.  He drinks minimally.   ALLERGIES:  He is INTOLERANT or ALLERGIC TO SULFA which caused fever,  LEVAQUIN which caused nausea and dizziness.   REVIEW OF SYSTEMS:  Review of systems was completed in toto.  He has had  some rosacea which had an atypical presentation.  He sees Dr. Donzetta Starch, Dermatologist, annually.  Specifically, he has no dyspepsia or  esophageal reflux symptoms.  Also NEUROPSYCHIATRIC review of systems is  negative.   There are no ENDOCRINOPATHY symptoms.   He will have two cups of coffee daily and possibly a soda daily but  denies other stimulants.   PHYSICAL EXAMINATION:  VITAL SIGNS:  He is 5 feet, 10 inches, weighs  177.  Temperature 98.5. Pulse 64.  Respiratory rate 16.  Blood pressure  140/80.  HEENT:  Pupils equal, round, reactive to light.  Fundal exam is  essentially negative.  Nares and otic canals are clear.  Dental hygiene  is excellent.  NECK:  Thyroid is normal to palpation with no nodules.  CHEST:  Clear with no increased work of breathing.  CARDIOVASCULAR: He has a classic click at the apex with no murmur.  All  pulses are intact and there is no edema.  ABDOMEN: He has no organomegaly or masses or lymphadenopathy.  NEUROPSYCHIATRIC:  Exam is unremarkable.  RECTAL:  Prostate is upper limits of normal with no nodularity.  Hemoccult testing is negative.   Review of the chart indicates that his LDL goal is less than 150 based  on NMR LipoProfile, ideal LDL would be less than 130.   CURRENT MEDICATIONS:  Presently, he is on Lipitor 20 mg one-half three  days a week and enteric coated aspirin 81 mg.   CLINICAL DATA:  The resting EKG showed sinus bradycardia with no  ischemic changes and no evidence of any accessory pathways.   Labs revealed normal CBC and differential and chemistries, with the  exception of glucose of 103.  Total cholesterol is 231 with an LDL of  154.  TSH was normal.  In reference to the palpitations, calcium and   potassium are both normal.  PSA is 3.60; in October of 2007 it was 2.03.  Review of the chart indicates that in April of 2007 it was 2.64  and in  April of 2006 it was 3.20.   He is on no specific diet.  Because of the significant elevation of the  LDL, I will ask him to review Dr. Gildardo Griffes book, Eat, Drink And Be  Healthy.  I would recommend that the Lipitor be increased to one-half  four days a week.  The lipids and hemoglobin A1C can be repeated in  three months.   Although the prostate reveals no nodules, the significant rise in the  PSA since October of 2007 I feel warrants urologic consultation.Juan Ford  will consider this recommendation. If he does not see a Urologist @ this  time he has agreed to have repeat PSA in 3 months with the lipids.   He has no evidence of any cardiopulmonary disease.  In reference to  palpitations, I would recommend avoiding stimulants, decongestants, diet  pills, nicotine or caffeine.  There is no contraindication to continuing  his exercise program.  There would be no contraindication to  insurability based on the exam and prior stress testings.   Were the palpitations to become more frequent, then an event monitor  could be performed.   Additionally, because of the glucose of 103, I will ask him to visit the  web site prevention.com, the Flat Belly Diet, a low-carb, heart-  healthy program.     Titus Dubin. Alwyn Ren, MD,FACP,FCCP  Electronically Signed    [facility]  DD: 07/19/2006  DT: 07/19/2006  Job #: [id number]

## 2010-09-13 ENCOUNTER — Telehealth: Payer: Self-pay | Admitting: *Deleted

## 2010-09-13 NOTE — Telephone Encounter (Signed)
Pt is has an upcoming physical, he will be seeing the cancer center the Friday before. Would like to have all bloodwork done at one time. Please advise on what labs you would want drawn for they physical.

## 2010-09-14 NOTE — Telephone Encounter (Signed)
Spoke with patient, patient's appointment at the cancer center is late in the afternoon and labs for CPX require patient to fast. Patient would like to VOID the idea of getting labs done at the cancer center and just get labs here a few days prior to CPX and discuss at CPX appointment . Appointment scheduled

## 2010-09-14 NOTE — Telephone Encounter (Signed)
BMET, lipids, hepatic panel, TSH, CBC& dif, PSA (v70.0) PSA. If not seeing Urologist

## 2010-09-23 ENCOUNTER — Other Ambulatory Visit: Payer: Self-pay | Admitting: Internal Medicine

## 2010-09-23 DIAGNOSIS — Z0000 Encounter for general adult medical examination without abnormal findings: Secondary | ICD-10-CM

## 2010-09-24 ENCOUNTER — Encounter: Payer: Self-pay | Admitting: Internal Medicine

## 2010-09-24 ENCOUNTER — Encounter (HOSPITAL_BASED_OUTPATIENT_CLINIC_OR_DEPARTMENT_OTHER): Payer: BC Managed Care – PPO | Admitting: Oncology

## 2010-09-24 ENCOUNTER — Other Ambulatory Visit (INDEPENDENT_AMBULATORY_CARE_PROVIDER_SITE_OTHER): Payer: BC Managed Care – PPO

## 2010-09-24 DIAGNOSIS — C099 Malignant neoplasm of tonsil, unspecified: Secondary | ICD-10-CM

## 2010-09-24 DIAGNOSIS — Z0000 Encounter for general adult medical examination without abnormal findings: Secondary | ICD-10-CM

## 2010-09-24 LAB — CBC WITH DIFFERENTIAL/PLATELET
Basophils Absolute: 0 10*3/uL (ref 0.0–0.1)
Eosinophils Absolute: 0.2 10*3/uL (ref 0.0–0.7)
Hemoglobin: 14 g/dL (ref 13.0–17.0)
Lymphocytes Relative: 24.7 % (ref 12.0–46.0)
Lymphs Abs: 1.1 10*3/uL (ref 0.7–4.0)
MCHC: 33.7 g/dL (ref 30.0–36.0)
Neutro Abs: 2.7 10*3/uL (ref 1.4–7.7)
RDW: 13.5 % (ref 11.5–14.6)

## 2010-09-24 LAB — HEPATIC FUNCTION PANEL
ALT: 22 U/L (ref 0–53)
AST: 23 U/L (ref 0–37)
Alkaline Phosphatase: 45 U/L (ref 39–117)
Bilirubin, Direct: 0.1 mg/dL (ref 0.0–0.3)
Total Protein: 6.5 g/dL (ref 6.0–8.3)

## 2010-09-24 LAB — BASIC METABOLIC PANEL
BUN: 24 mg/dL — ABNORMAL HIGH (ref 6–23)
Calcium: 9.2 mg/dL (ref 8.4–10.5)
Creatinine, Ser: 1.5 mg/dL (ref 0.4–1.5)
GFR: 51.06 mL/min — ABNORMAL LOW (ref >60.00)
Glucose, Bld: 89 mg/dL (ref 70–99)

## 2010-09-24 LAB — POCT URINALYSIS DIPSTICK
Blood, UA: NEGATIVE
Protein, UA: NEGATIVE
Spec Grav, UA: 1.005
Urobilinogen, UA: 0.2
pH, UA: 5

## 2010-09-24 NOTE — Progress Notes (Signed)
Labs only

## 2010-09-27 ENCOUNTER — Ambulatory Visit (INDEPENDENT_AMBULATORY_CARE_PROVIDER_SITE_OTHER): Payer: BC Managed Care – PPO | Admitting: Internal Medicine

## 2010-09-27 ENCOUNTER — Encounter: Payer: Self-pay | Admitting: Internal Medicine

## 2010-09-27 VITALS — BP 118/76 | HR 68 | Ht 69.75 in | Wt 170.0 lb

## 2010-09-27 DIAGNOSIS — Z85828 Personal history of other malignant neoplasm of skin: Secondary | ICD-10-CM

## 2010-09-27 DIAGNOSIS — L719 Rosacea, unspecified: Secondary | ICD-10-CM | POA: Insufficient documentation

## 2010-09-27 DIAGNOSIS — Z0000 Encounter for general adult medical examination without abnormal findings: Secondary | ICD-10-CM

## 2010-09-27 DIAGNOSIS — E785 Hyperlipidemia, unspecified: Secondary | ICD-10-CM

## 2010-09-27 DIAGNOSIS — C090 Malignant neoplasm of tonsillar fossa: Secondary | ICD-10-CM

## 2010-09-27 DIAGNOSIS — I498 Other specified cardiac arrhythmias: Secondary | ICD-10-CM

## 2010-09-27 DIAGNOSIS — E039 Hypothyroidism, unspecified: Secondary | ICD-10-CM

## 2010-09-27 MED ORDER — LEVOTHYROXINE SODIUM 100 MCG PO TABS: 100.0000 ug | ORAL_TABLET | Freq: Every day | ORAL | Status: DC

## 2010-09-27 NOTE — Progress Notes (Signed)
  Subjective:    Patient ID: Juan Ford, male    DOB: 08-20-1950, 60 y.o.   MRN: [id number]  HPI  Mr Kaufman  is here for a physical he has no ;acute issues .      Review of Systems Patient reports no  vision/ hearing changes,anorexia, weight change, fever ,adenopathy, persistant / recurrent hoarseness, swallowing issues, chest pain,palpitations, edema,persistant / recurrent cough, hemoptysis, dyspnea(rest, exertional, paroxysmal nocturnal), gastrointestinal  bleeding (melena, rectal bleeding), abdominal pain, excessive heart burn, GU symptoms( dysuria, hematuria, pyuria, voiding/incontinence  issues) syncope, focal weakness, memory loss,numbness & tingling, skin/hair/nail changes,depression, anxiety, or  abnormal bruising/bleeding . 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     Objective:   Physical Exam Gen.: Healthy and well-nourished in appearance. Alert, appropriate and cooperative throughout exam. Head: Normocephalic without obvious abnormalities;  no alopecia  Eyes: No corneal or conjunctival inflammation noted. Pupils equal round reactive to light and accommodation. Fundal exam is benign without hemorrhages, exudate, papilledema. Extraocular motion intact. Vision grossly normal. Ears: External  ear exam reveals no significant lesions or deformities. Canals clear .TMs normal. Hearing is grossly normal bilaterally. Nose: External nasal exam reveals no deformity or inflammation. Nasal mucosa are pink and moist. No lesions or exudates noted. Septum not  deviated Mouth: Oral mucosa and oropharynx reveal no lesions or exudates. Teeth in good repair. Neck: No deformities, masses, or tenderness noted. Range of motion &. Thyroid normal. Lungs: Normal respiratory effort; chest expands symmetrically. Lungs are clear to auscultation without rales, wheezes, or increased work of breathing. Heart: Slow rate & regular  rhythm. Normal S1 and S2. No gallop, click, or rub. No  murmur. Abdomen: Bowel sounds normal; abdomen soft and nontender. No masses, organomegaly or hernias noted. Genitalia/DRE: normal                                                                                      Musculoskeletal/extremities: No deformity or scoliosis noted of  the thoracic or lumbar spine. No clubbing, cyanosis, or  edema Range of motion  Decreased R > L knee .Tone & strength  Normal.Joints: DIP OA changes. Nail health  good. Vascular: Carotid, radial artery, dorsalis pedis and dorsalis posterior tibial pulses are full and equal. No bruits present. Neurologic: Alert and oriented x3. Deep tendon reflexes symmetrical and normal.          Skin: Intact without suspicious lesions or rashes. Lymph: No cervical, axillary, or inguinal lymphadenopathy present. Psych: Mood and affect are normal. Normally interactive                                                                                       Assessment & Plan:  #1 comprehensive physical exam; no acute findings #2 see Problem List with Assessments & Recommendations Plan: see Orders

## 2010-09-27 NOTE — Patient Instructions (Addendum)
Preventive Health Care: Exercise at least 30-45 minutes a day,  3-4 days a week.  Eat a low-fat diet with lots of fruits and vegetables, up to 7-9 servings per day.Consume less than 40 grams of sugar per day from foods & drinks with High Fructose Corn Sugar as #2,3 or # 4 on label. Aleve 1-2 every 8-12 hrs as needed with food. Schedule TSH in 8-10 weeks(244.9)

## 2010-12-25 IMAGING — CT CT NECK W/ CM
3 of 4 series · 16 of 33 positions shown, 19 images · IV contrast (75CC OMNI 300)
Comparison: [REDACTED] chest x-ray 12/03/2007.

CLINICAL DATA: Right neck mass on physical exam.

CT NECK WITH CONTRAST
TECHNIQUE: Multidetector CT imaging of the neck was performed with
intravenous contrast.
Contrast: 75 ml Wmnipaque-EDD intravenous.

[Series 3: soft tissue neck · axial · 0.49mm/px · z∈[-11,+203]mm · 8 of 73 slices shown, 10 images]
[im 8/73  soft-tissue]
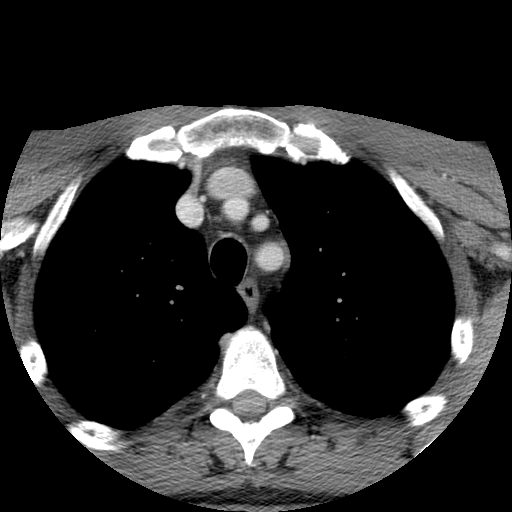
[im 8/73  bone]
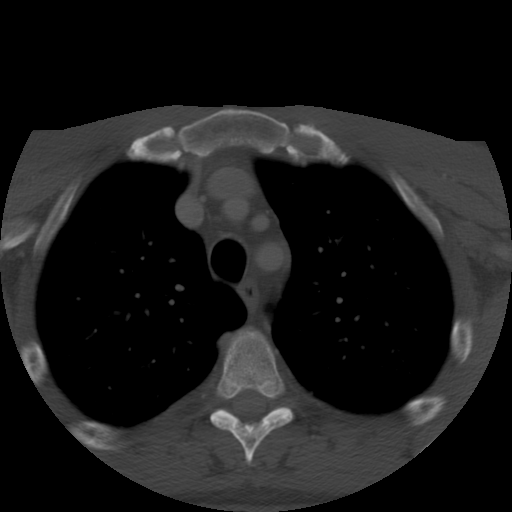
[im 15/73  bone]
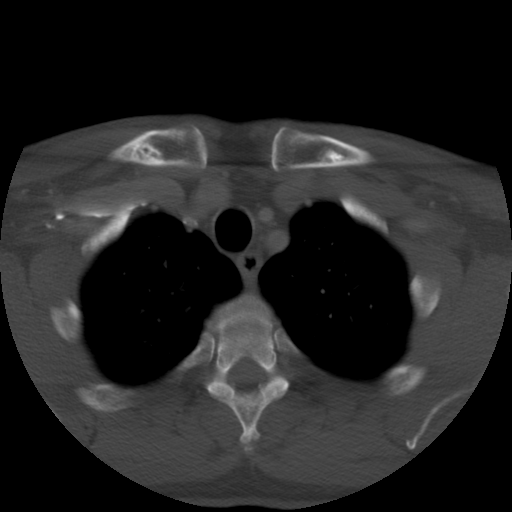
[im 22/73  bone]
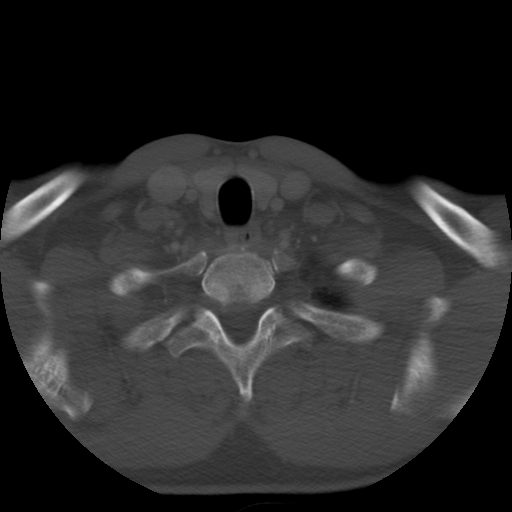
[im 29/73  bone]
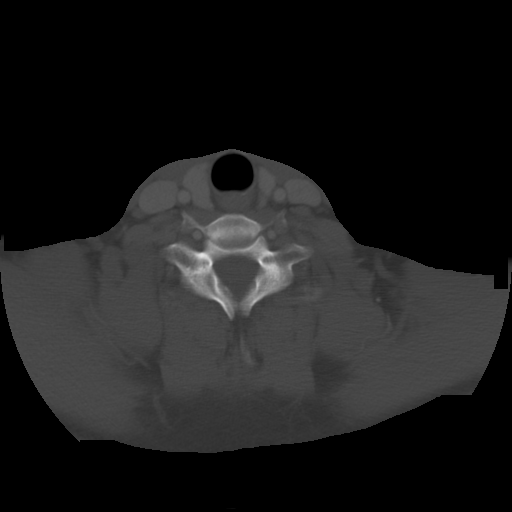
[im 44/73  soft-tissue]
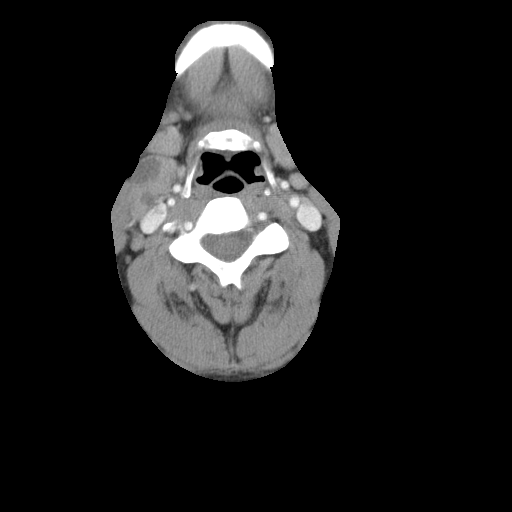
[im 44/73  bone]
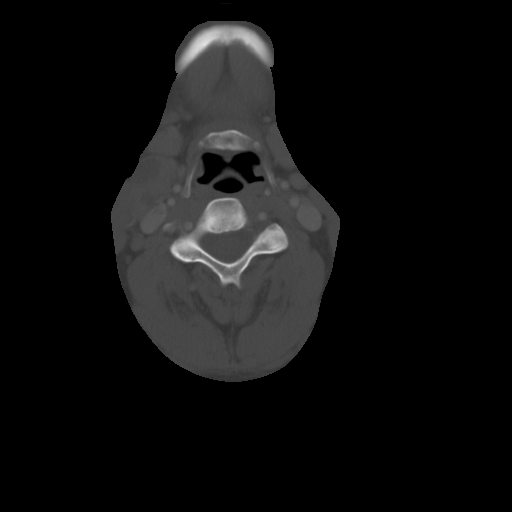
[im 51/73  bone]
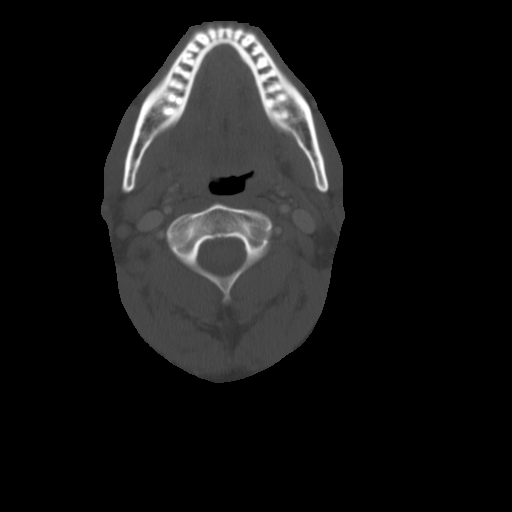
[im 58/73  bone]
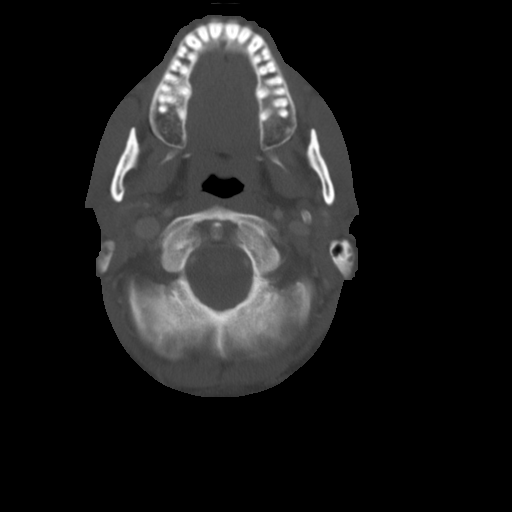
[im 65/73  bone]
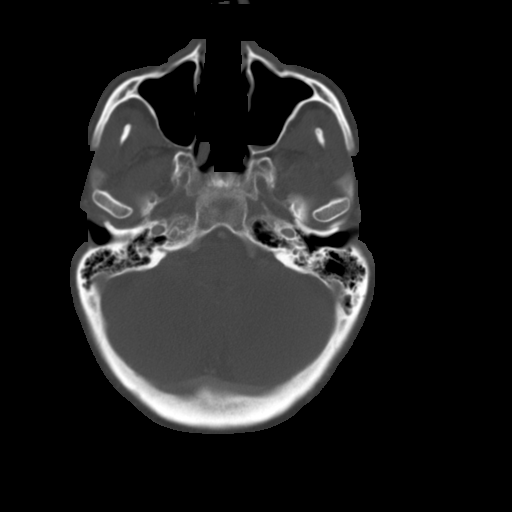

[Series 200: sagittal · sagittal · 0.55mm/px · 5 of 102 slices shown, 6 images]
[im 34/102  bone]
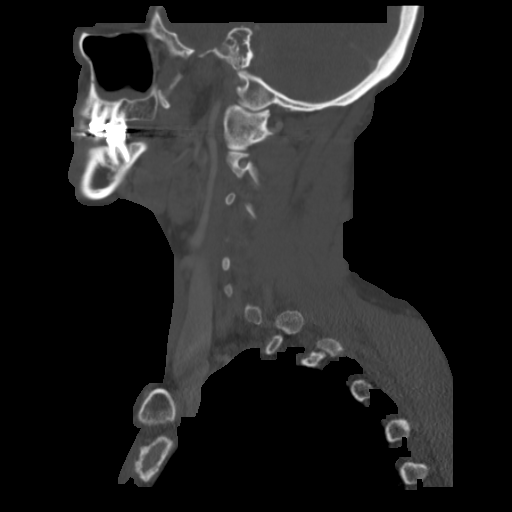
[im 43/102  bone]
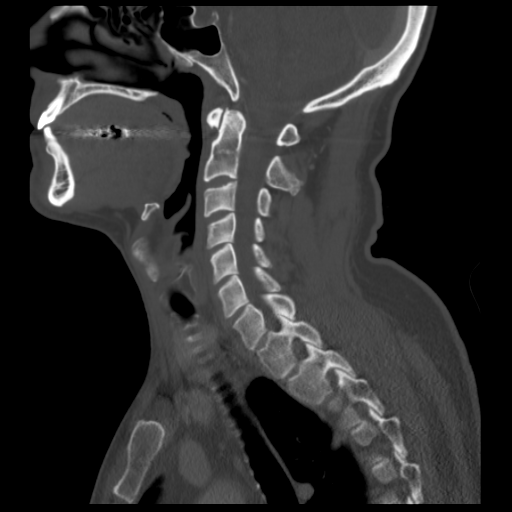
[im 51/102  soft-tissue]
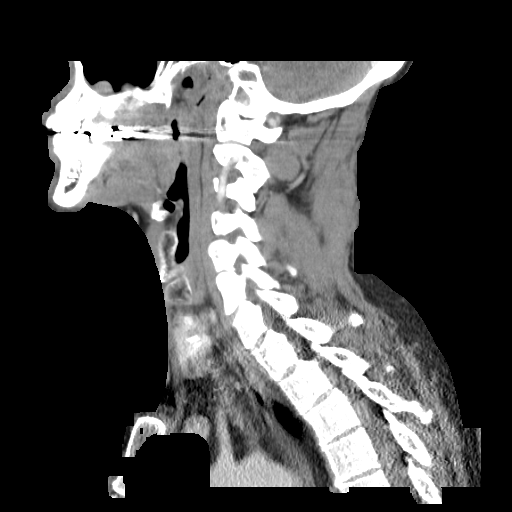
[im 51/102  bone]
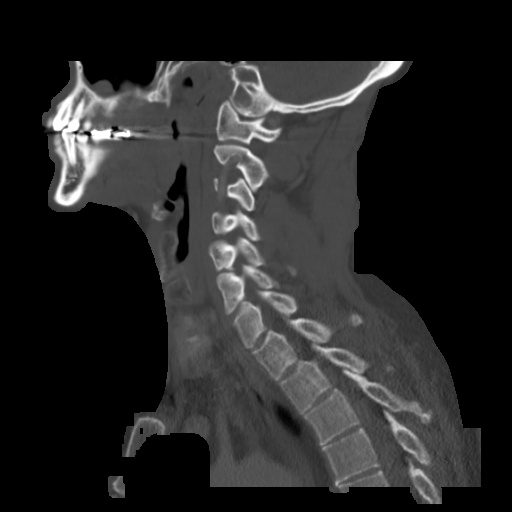
[im 59/102  bone]
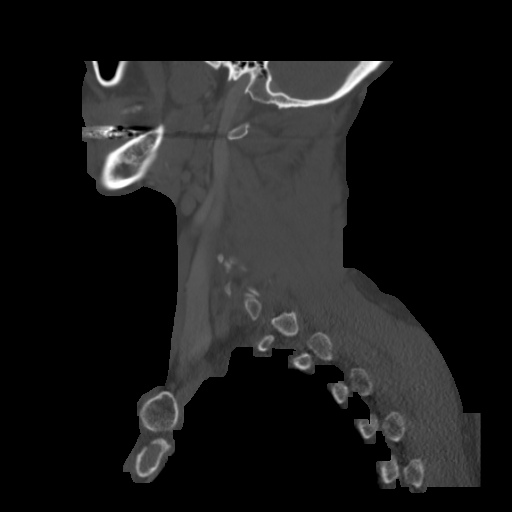
[im 68/102  bone]
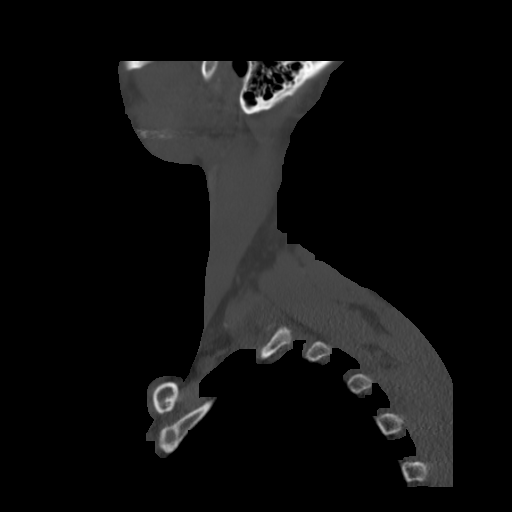

[Series 201: coronal · coronal · 0.55mm/px · 3 of 92 slices shown]
[im 19/92  bone]
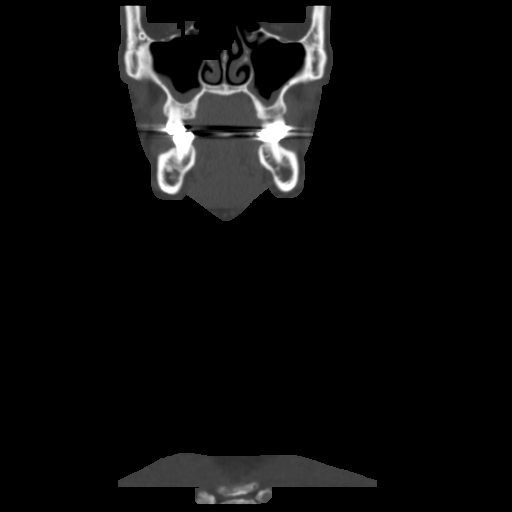
[im 37/92  bone]
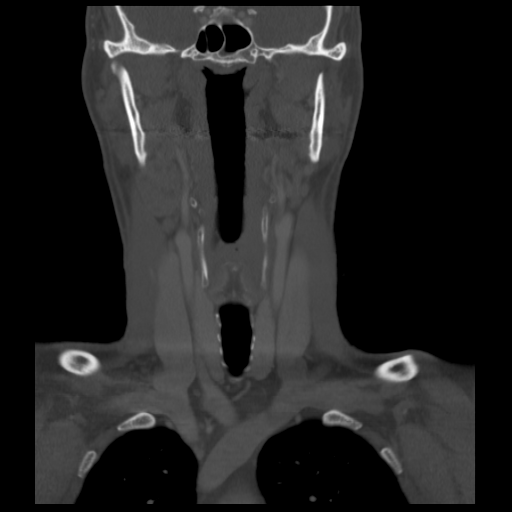
[im 55/92  bone]
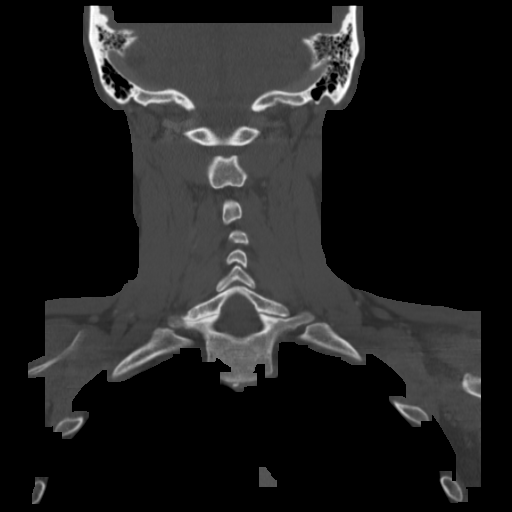

[16 of 33 positions shown; findings below may reference images not displayed]

FINDINGS: At the right level II/jugulodigastric level is
heterogeneous markedly enlarged lymph node with areas of central
necrosis measuring 4.1 cm long (coronal image 37) X 2.2 cm AP X
cm wide (axial image 28).  Increased number small size
(subcentimeter) right level V/posterior triangle and lesser left
level III/internal jugular chain and right I/submental lymph nodes
seen with no other significant cervical adenopathy visualized.
Subtle soft tissue thickening is seen measuring 1.7 cm wide (axial
image 21 and coronal image 35) at the right at the pharyngeal wall.
Visualized brain, salivary glands, and hypopharynx, larynx,
thyroid, superior mediastinum, and lung apices appear normal.  No
significant osseous abnormality is noted.  Slight chronic paranasal
sinusitis mucosal thickening is seen at the inferior bilateral
maxillary antra with visualized paranasal sinuses and mastoid air
cells clear.
IMPRESSION: 1.  Large right level II/jugulodigastric level adenopathy with
areas of central necrosis.  Subtle thickening at the right
pharyngeal wall suspicious for primary tumor.  Recommend
correlation with physical exam and right cervical lymph node biopsy
for tissue diagnosis.  Nuclear medicine PET-CT may be contributory
as clinically indicated.

2.  Otherwise no significant abnormality.

## 2011-01-28 ENCOUNTER — Ambulatory Visit: Payer: BC Managed Care – PPO | Admitting: Radiation Oncology

## 2011-02-01 ENCOUNTER — Telehealth: Payer: Self-pay | Admitting: Internal Medicine

## 2011-02-01 MED ORDER — ATORVASTATIN CALCIUM 20 MG PO TABS: 20.0000 mg | ORAL_TABLET | ORAL | Status: DC

## 2011-02-01 NOTE — Telephone Encounter (Signed)
RX sent

## 2011-02-01 NOTE — Telephone Encounter (Signed)
Refill atorvastatin 20 mg - [facility] [address] -

## 2011-03-03 ENCOUNTER — Telehealth: Payer: Self-pay | Admitting: *Deleted

## 2011-03-03 NOTE — Telephone Encounter (Signed)
  Please call pt.  1.  P-16 (NOT HPV-16)  is a surrogate test for HPV positivity.  Not necessarily HPV-16.   And yes, people can have multiple strains of HPV's. 2.  No change with current sexual activity if he and his wife remain monogamous. 3.  His wife should have routine PAP smear.  As of now, there is no guideline to test spouse for HPV since 10% of [place] are carrier in the mouth, and not all 10% of these carriers in the mouth develop head/neck cancer.   Only if spouses have neck swelling, persistent mouth sore, then an ENT eval is indicated.  Just being HPV carrier in the mouth does not warrant an ENT visit.

## 2011-03-03 NOTE — Telephone Encounter (Signed)
Pt called w/ questions for Dr. Gaylyn Rong.   1. 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  Now 2 weeks ago had oral HPV test done by Dentist (Dr. 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   Pt asks if the HPV 45 is new or just not discovered on tonsil bx?  Can he have several different types?   2.   Is there anything different he needs to be watching out for since discovering the new strain? Is he at any higher risk of developing recurrence or a new cancer? (pt says he believes that 16, 18, 31 and 45 are high risk).    2. Should his wife be tested for HPV?

## 2011-03-04 ENCOUNTER — Telehealth: Payer: Self-pay | Admitting: *Deleted

## 2011-03-04 NOTE — Telephone Encounter (Signed)
Called pt back to relay Dr. Lodema Pilot response to his questions.  Left VM for pt to call back.

## 2011-03-04 NOTE — Telephone Encounter (Signed)
Pt called back and I relayed message from Dr. Gaylyn Rong in reponse to his questions r/t HPV.  Please see Dr. Lodema Pilot note below.  Pt verbalized understanding and plans to keep appt as scheduled in December.

## 2011-03-11 ENCOUNTER — Encounter: Payer: Self-pay | Admitting: *Deleted

## 2011-03-14 ENCOUNTER — Encounter: Payer: Self-pay | Admitting: Oncology

## 2011-03-22 ENCOUNTER — Ambulatory Visit (INDEPENDENT_AMBULATORY_CARE_PROVIDER_SITE_OTHER): Payer: BC Managed Care – PPO | Admitting: Family

## 2011-03-22 ENCOUNTER — Encounter: Payer: Self-pay | Admitting: Family

## 2011-03-22 VITALS — BP 124/90 | HR 72 | Temp 97.8°F | Resp 16 | Wt 174.1 lb

## 2011-03-22 DIAGNOSIS — J029 Acute pharyngitis, unspecified: Secondary | ICD-10-CM

## 2011-03-22 LAB — POCT RAPID STREP A (OFFICE): Rapid Strep A Screen: NEGATIVE

## 2011-03-22 NOTE — Patient Instructions (Signed)

## 2011-03-22 NOTE — Progress Notes (Signed)
Subjective:    Patient ID: Juan Ford, male    DOB: 11/04/1950, 60 y.o.   MRN: [id number]  HPI  Mr.  Juan Ford is 60 yr old male who presents today with chief complaint of sore throat.  Symptoms started on Saturday.  Worsened last night.  This AM he noted some "pockets of pus" this AM on his throat.  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  (2010). He denies known fever, cough or cold symptoms or exposure to sore throat.     Review of Systems    see HPI  Past Medical History  Diagnosis Date  . Hyperlipidemia   . Cancer     tonsillar, Dr bates  . HYPOTHYROIDISM 03/23/2009  . HYPERLIPIDEMIA 08/10/2007  . 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 Marital Status: Married    Spouse Name: N/A    Number of Children: N/A  . Years of Education: N/A   Occupational History  . Not on file.   Social History Main Topics  . Smoking status: Never Smoker   . Smokeless tobacco: Not on file  . Alcohol Use: Yes     7-10 / week  . Drug Use: No  . Sexually Active: Not on file   Other Topics Concern  . Not on file   Social History Narrative  . No narrative on file    Past Surgical History  Procedure Date  . Knee arthroscopy 2002    L  . Colonoscopy 08/2002, 2009    Negative, 2019  . Knee surgery     L age 24; R @ 72  . Catheter radiofrequency ablation 06/2009    Family History  Problem Relation Age of Onset  . Breast cancer Mother   . Diabetes Mother   . Colon polyps Mother     Tics  . Esophageal cancer Father     smoker  . Heart failure Father   . Heart disease Father     CHF    Allergies  Allergen Reactions  . Levaquin Nausea And Vomiting  . Sulfonamide Derivatives     REACTION: fever, chills as a child    Current Outpatient Prescriptions on File Prior to Visit  Medication Sig Dispense Refill  . aspirin 81 MG tablet Take 81 mg by mouth daily.        Marland Kitchen atorvastatin (LIPITOR) 20 MG  tablet Take 1 tablet (20 mg total) by mouth as directed.  90 tablet  1  . levothyroxine (SYNTHROID) 100 MCG tablet Take 1 tablet (100 mcg total) by mouth daily.  90 tablet  3  . Omega-3 Fatty Acids (FISH OIL) 300 MG CAPS Take by mouth.          BP 124/90  Pulse 72  Temp(Src) 97.8 F (36.6 C) (Oral)  Resp 16  Wt 174 lb 1.9 oz (78.98 kg)    Objective:   Physical Exam  Constitutional: He appears well-developed and well-nourished. No distress.  HENT:  Head: Normocephalic and atraumatic.  Right Ear: Tympanic membrane and ear canal normal.  Left Ear: Tympanic membrane and ear canal normal.       L tonsil with small white blisters  Neck: Normal range of motion. Neck supple.  Cardiovascular: Normal rate and regular rhythm.   No murmur heard. Pulmonary/Chest: Effort normal and breath sounds normal. No respiratory distress. He has no wheezes. He has no rales.  Lymphadenopathy:  He has no cervical adenopathy.  Skin: Skin is warm and dry.  Psychiatric: He has a normal mood and affect. His behavior is normal. Judgment and thought content normal.          Assessment & Plan:

## 2011-03-22 NOTE — Assessment & Plan Note (Signed)
Recommended supportive measures.  Rapid strep is negative.  He will contact us if symptoms worsen or if no improvement in 2-3 days.

## 2011-03-24 ENCOUNTER — Ambulatory Visit (HOSPITAL_BASED_OUTPATIENT_CLINIC_OR_DEPARTMENT_OTHER): Payer: BC Managed Care – PPO | Admitting: Oncology

## 2011-03-24 ENCOUNTER — Other Ambulatory Visit (HOSPITAL_BASED_OUTPATIENT_CLINIC_OR_DEPARTMENT_OTHER): Payer: BC Managed Care – PPO | Admitting: Lab

## 2011-03-24 ENCOUNTER — Other Ambulatory Visit: Payer: Self-pay | Admitting: Oncology

## 2011-03-24 DIAGNOSIS — C109 Malignant neoplasm of oropharynx, unspecified: Secondary | ICD-10-CM

## 2011-03-24 DIAGNOSIS — K117 Disturbances of salivary secretion: Secondary | ICD-10-CM

## 2011-03-24 DIAGNOSIS — C099 Malignant neoplasm of tonsil, unspecified: Secondary | ICD-10-CM

## 2011-03-24 DIAGNOSIS — C090 Malignant neoplasm of tonsillar fossa: Secondary | ICD-10-CM

## 2011-03-24 DIAGNOSIS — E785 Hyperlipidemia, unspecified: Secondary | ICD-10-CM

## 2011-03-24 DIAGNOSIS — E039 Hypothyroidism, unspecified: Secondary | ICD-10-CM

## 2011-03-24 LAB — CBC WITH DIFFERENTIAL/PLATELET
Basophils Absolute: 0 10*3/uL (ref 0.0–0.1)
HCT: 39.7 % (ref 38.4–49.9)
HGB: 13.7 g/dL (ref 13.0–17.1)
MONO#: 0.8 10*3/uL (ref 0.1–0.9)
NEUT%: 79.1 % — ABNORMAL HIGH (ref 39.0–75.0)
WBC: 10.3 10*3/uL (ref 4.0–10.3)
lymph#: 1.1 10*3/uL (ref 0.9–3.3)

## 2011-03-24 LAB — COMPREHENSIVE METABOLIC PANEL
BUN: 19 mg/dL (ref 6–23)
CO2: 31 mEq/L (ref 19–32)
Calcium: 9.6 mg/dL (ref 8.4–10.5)
Chloride: 105 mEq/L (ref 96–112)
Creatinine, Ser: 1.39 mg/dL — ABNORMAL HIGH (ref 0.50–1.35)
Glucose, Bld: 108 mg/dL — ABNORMAL HIGH (ref 70–99)

## 2011-03-24 LAB — TSH: TSH: 4.35 u[IU]/mL (ref 0.350–4.500)

## 2011-03-24 NOTE — Progress Notes (Signed)
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  PAST THERAPY:  Definitive concurrent chemoradiation therapy with cisplatin once every 3 weeks.  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  His last dose of radiation was 08/05/2008.    CURRENT THERAPY:  watchful observation.  INTERVAL HISTORY: Juan Ford 60 y.o. male returns for regular follow up.  He recently had mouthsore, was evaluated and was thought to have viral pharyngitis.  His mouth sore has completely resolved a few weeks ago.  He has residual xerostomia; however, he has no problem with foods.  He denies dysphagia, odynophagia, mucositis, palpable cervical node or any nodal swelling.  He is working full time. He earlier this year ran a half marathon.  A few months ago, his dentist tested his oropharynx sample, which turned positive for HPV subtype 45.  He wanted to know what to do.   Patient denies fatigue, headache, visual changes, confusion, drenching night sweats, palpable lymph node swelling, mucositis, odynophagia, dysphagia, nausea vomiting, jaundice, chest pain, palpitation, shortness of breath, dyspnea on exertion, productive cough, gum bleeding, epistaxis, hematemesis, hemoptysis, abdominal pain, abdominal swelling, early satiety, melena, hematochezia, hematuria, skin rash, spontaneous bleeding, joint swelling, joint pain, heat or cold intolerance, bowel bladder incontinence, back pain, focal motor weakness, paresthesia, depression, suicidal or homocidal ideation, feeling hopelessness.   MEDICAL HISTORY: Past Medical History  Diagnosis Date  . Hyperlipidemia   . Cancer     tonsillar, Dr bates  . HYPOTHYROIDISM 03/23/2009  . HYPERLIPIDEMIA 08/10/2007  . 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 Knee arthroscopy 2002    L  . Colonoscopy 08/2002, 2009    Negative, 2019  . Knee surgery     L age 49; R @ 65  . Catheter radiofrequency ablation 06/2009    MEDICATIONS: Current Outpatient Prescriptions  Medication Sig Dispense Refill  . aspirin 81 MG tablet Take 81 mg by mouth daily.        Marland Kitchen atorvastatin (LIPITOR) 20 MG tablet Take 1 tablet (20 mg total) by mouth as directed.  90 tablet  1  . levothyroxine (SYNTHROID) 100 MCG tablet Take 1 tablet (100 mcg total) by mouth daily.  90 tablet  3  . Omega-3 Fatty Acids (FISH OIL) 300 MG CAPS Take by mouth.          ALLERGIES:  is allergic to levaquin and sulfonamide derivatives.  REVIEW OF SYSTEMS:  The rest of the 14-point review of system was negative.   Filed Vitals:   03/24/11 1502  BP: 141/85  Pulse: 56  Temp: 97.3 F (36.3 C)   Wt Readings from Last 3 Encounters:  03/24/11 173 lb 1.6 oz (78.518 kg)  03/22/11 174 lb 1.9 oz (78.98 kg)  09/24/10 169 lb 3.2 oz (76.749 kg)   ECOG Performance status: 0  PHYSICAL EXAMINATION:  General:  well-nourished in no acute distress.  Eyes:  no scleral icterus.  ENT:  There were no oropharyngeal lesions on my unaided exam.  Neck was without thyromegaly.  Lymphatics:  Negative cervical, supraclavicular or axillary adenopathy.  Respiratory: lungs were clear bilaterally without wheezing or crackles.  Cardiovascular:  Regular rate and rhythm, S1/S2, without murmur, rub or gallop.  There was no pedal edema.  GI:  abdomen was  soft, flat, nontender, nondistended, without organomegaly.  Muscoloskeletal:  no spinal tenderness of palpation of vertebral spine.  Skin exam was without echymosis, petichae.  Neuro exam was nonfocal.  Patient was able to get on and off exam table without assistance.  Gait was normal.  Patient was alerted and oriented.  Attention was good.   Language was appropriate.  Mood was normal without depression.  Speech was not pressured.  Thought content was not  tangential.    LABORATORY/RADIOLOGY DATA:  Lab Results  Component Value Date   WBC 10.3 03/24/2011   HGB 13.7 03/24/2011   HCT 39.7 03/24/2011   PLT 222 03/24/2011   GLUCOSE 108* 03/24/2011   CHOL 214* 09/24/2010   TRIG 47.0 09/24/2010   HDL 78.70 09/24/2010   LDLDIRECT 105.0 09/24/2010   LDLCALC 115* 09/24/2009   ALT 24 03/24/2011   AST 22 03/24/2011   NA 141 03/24/2011   K 4.1 03/24/2011   CL 105 03/24/2011   CREATININE 1.39* 03/24/2011   BUN 19 03/24/2011   CO2 31 03/24/2011   TSH 4.57 09/24/2010   PSA 2.92 09/24/2010   INR 1.2 ratio* 06/08/2009   HGBA1C 5.6 10/31/2006    ASSESSMENT AND PLAN:   1. History of oropharyngeal squamous cell carcinoma:  I discussed with Juan Ford that he has no evidence of recurrent disease on clinical history, physical exam, laboratory tests.  As he is two years out from the finish of therapy, there is no indication for routine CT of the neck unless he has any focal symptoms which he does not have any at this time.   2. Xerostomia:  Does not prevent him from eating any regular food that he experienced before.  He is not avoiding any kind of food because of xerostomia.  I discussed with him that xerostomia may be a chronic issue; however, may not be able to completely resolve 100%.  This is a normal occurrence in patients with this type of cancer.  3. HPV 45 positivity per dentist's testing:  I advised him that per NCCN guideline, there is no clear guidance as to what to do with this information.  About 10% of all comers are HPV carriers who do not all develop HNSCC.  There is no treatment for HPV.  I strongly urge him to have follow up with ENT and Rad Onc who can perform in office surveillance flexible endoscopy.  If he has persistent sore throat or cervical adenopathy more than 3 weeks, then he should inform us.  Same thing goes for his wife.  He said that his current wife (2nd wife) is tested HPV negative per PAP.  However, there is no guideline per NCCN at  this time for spouse of patient with HPV+ HNSCC to undergo routine screening laryngoscopy.  Again, if she has persistent odynophagia, dysphagia, or cervical adenopathy, then she needs to seek out an ENT evaluation.  4. Hyperlipidemia:  He is on atorvastatin per PCP. 5. Hypothyroidism:  He is on levothyroxine.  There is no clinical evidence of hypothyroidism on clinical history today.   6. Follow up with me in about 6 months but sooner if he has any concerning symptoms.   The length of time of the face-to-face encounter was 30 minutes. More than 50% of time was spent counseling and coordination of care.

## 2011-04-01 ENCOUNTER — Ambulatory Visit (INDEPENDENT_AMBULATORY_CARE_PROVIDER_SITE_OTHER): Payer: BC Managed Care – PPO | Admitting: Internal Medicine

## 2011-04-01 ENCOUNTER — Encounter: Payer: Self-pay | Admitting: Internal Medicine

## 2011-04-01 VITALS — BP 120/88 | HR 73 | Temp 98.3°F | Wt 173.6 lb

## 2011-04-01 DIAGNOSIS — B349 Viral infection, unspecified: Secondary | ICD-10-CM

## 2011-04-01 DIAGNOSIS — J029 Acute pharyngitis, unspecified: Secondary | ICD-10-CM

## 2011-04-01 DIAGNOSIS — B9789 Other viral agents as the cause of diseases classified elsewhere: Secondary | ICD-10-CM

## 2011-04-01 NOTE — Patient Instructions (Addendum)
Zicam Melts or Zinc lozenges ; vitamin C 2000 mg daily; & Echinacea for 4-7 days. Report fever, exudate("pus") or progressive pain.

## 2011-04-01 NOTE — Progress Notes (Signed)
  Subjective:    Patient ID: Juan Ford, male    DOB: 02/11/1951, 60 y.o.   MRN: [id number]  HPI Respiratory tract infection Onset/symptoms:ST  12/18 with blisters ; Beta Strep negative @ [facility] Exposures (illness/environmental/extrinsic):no Progression of symptoms:to progressive rash of tongue  Treatments/response:NSAIDS for ST which resolved Present symptoms: Fever/chills/sweats:sweats X 2 nights which has been intermittent symptom for 2 years Frontal headache:no Facial pain:no Nasal purulence:no Dental pain:no Lymphadenopathy:no Cough/sputum/hemoptysis:no Note : not on ACE-I         Review of Systems  Intermittently he has had some "muscular spasm" under the right mandible following radiation treatments. This is not persistent and not associated with any swelling     Objective:   Physical Exam General appearance is of good health and nourishment; no acute distress or increased work of breathing is present.  No  lymphadenopathy about the head, neck, or axilla noted.   Eyes: No conjunctival inflammation or lid edema is present.   Ears:  External ear exam shows no significant lesions or deformities.  Otoscopic examination reveals clear canals, tympanic membranes are intact bilaterally without bulging, retraction, inflammation or discharge.  Nose:  External nasal examination shows no deformity or inflammation. Nasal mucosa are pink and moist without lesions or exudates. No septal dislocation  Oral exam: Dental hygiene is good; lips and gums are healthy appearing.There is no oropharyngeal erythema or exudate noted.  Minimal coating of tongue  Neck:  No deformities, thyromegaly, masses, or tenderness noted.   Supple with full range of motion without pain.    Skin: Warm & dry w/o jaundice or tenting.          Assessment & Plan:   #1 pharyngitis, most likely viral. No clinical evidence of active oral infection. Clinically is resolving. Options to  facilitate this were discussed.  #2 possible intermittent spasm of the parotid gland/duct. He'll be asked to monitor for any trigger such as heart candy or citrus products.

## 2011-05-12 ENCOUNTER — Ambulatory Visit (INDEPENDENT_AMBULATORY_CARE_PROVIDER_SITE_OTHER): Payer: Self-pay | Admitting: Internal Medicine

## 2011-05-12 VITALS — BP 138/78 | HR 56 | Temp 97.5°F | Ht 70.25 in | Wt 176.0 lb

## 2011-05-12 DIAGNOSIS — E039 Hypothyroidism, unspecified: Secondary | ICD-10-CM

## 2011-05-12 NOTE — Patient Instructions (Signed)
Please take the probiotic , Align, every day until the bowels are normal. This will replace the normal bacteria which  are necessary for formation of normal stool and processing of food. 

## 2011-05-12 NOTE — Progress Notes (Signed)
  Subjective:    Patient ID: Juan Ford, male    DOB: 1950/09/22, 61 y.o.   MRN: [id number]  HPI On 05/03/11 he experienced one watery bowel movement. In retrospect he believes his stools  had become softer beginning in November of 2012. In the past 24 hours he describes half a dozen watery stools. He denies any exposures to similarly effected individuals. He's had no abdominal pain but simply increased "gurgling".He's had no rectal bleeding or melena.There have been no travel, exposure, or suspect food or water exposures Colonoscopy 2009 was negative. His mother has history of polyps THis wife was diagnosed with celiac disease and he has been on a decreased gluten diet over the past year.    Review of Systems     Objective:   Physical Exam  Gen.: Thin but well-nourished; in no acute distress Eyes: Extraocular motion intact; no lid lag or proptosis  Neck: Subcutaneous induration related to previous radiation. No significant thyroid abnormalities. Heart: Normal rhythm and rate without significant murmur, gallop. Intermittent click @ apex Lungs: Chest clear to auscultation without rales,rales, wheezes Neuro:Deep tendon reflexes are equal and within normal limits; no tremor  Skin: Warm and dry without significant lesions or rashes; no onycholysis Psych: Normally communicative and interactive; no abnormal mood or affect clinically.         Assessment & Plan:  #1 bowel changes; possibly multifactorial. 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 Excess thyroid supplementation resting considered. History and physical do not suggest an infectious process or noninfectious colitis.  Plan: TSH; Align daily as needed

## 2011-05-19 ENCOUNTER — Telehealth: Payer: Self-pay | Admitting: *Deleted

## 2011-05-19 MED ORDER — CIPROFLOXACIN HCL 500 MG PO TABS: 500.0000 mg | ORAL_TABLET | Freq: Two times a day (BID) | ORAL | Status: AC

## 2011-05-19 MED ORDER — METRONIDAZOLE 250 MG PO TABS: 250.0000 mg | ORAL_TABLET | Freq: Three times a day (TID) | ORAL | Status: AC

## 2011-05-19 NOTE — Telephone Encounter (Signed)
Discuss with patient, Rx sent. 

## 2011-05-19 NOTE — Telephone Encounter (Signed)
Cipro 500 milligrams twice a day; dispense #10. Metronidazole 250 mg 3 times a day; dispense 15. Avoid excess alcohol while on metronidazole. GI consult if symptoms persist.

## 2011-05-19 NOTE — Telephone Encounter (Signed)
Pt states that he has been doing the probiotic, Align, every day x8days but he has not seen any improvement. Pt would like to know if he should continue with the align and if there are any other changes he needs to make since his condition is failing to improve. Please advise

## 2011-07-11 ENCOUNTER — Telehealth: Payer: Self-pay | Admitting: Internal Medicine

## 2011-07-11 DIAGNOSIS — Z0000 Encounter for general adult medical examination without abnormal findings: Secondary | ICD-10-CM

## 2011-07-11 DIAGNOSIS — E039 Hypothyroidism, unspecified: Secondary | ICD-10-CM

## 2011-07-11 DIAGNOSIS — T887XXA Unspecified adverse effect of drug or medicament, initial encounter: Secondary | ICD-10-CM

## 2011-07-11 DIAGNOSIS — E785 Hyperlipidemia, unspecified: Secondary | ICD-10-CM

## 2011-07-11 NOTE — Telephone Encounter (Signed)
Patient has cpe on 09-13-11, labs on 09-06-11. Patient has bcbs. He would like labs prior to seeing Dr Alwyn Ren. I need dx codes for labs, please.

## 2011-07-11 NOTE — Telephone Encounter (Signed)
Future orders placed 

## 2011-07-29 ENCOUNTER — Ambulatory Visit (INDEPENDENT_AMBULATORY_CARE_PROVIDER_SITE_OTHER): Payer: BC Managed Care – PPO | Admitting: Internal Medicine

## 2011-07-29 ENCOUNTER — Encounter: Payer: Self-pay | Admitting: Internal Medicine

## 2011-07-29 VITALS — BP 120/78 | HR 59 | Temp 98.2°F | Wt 171.6 lb

## 2011-07-29 DIAGNOSIS — B354 Tinea corporis: Secondary | ICD-10-CM

## 2011-07-29 NOTE — Patient Instructions (Signed)
After showering  use a hairdryer to blow the nails dry. Also do this after any activities which cause sweating. Change socks repeatedly through the day if the feet do sweat. Tennis shoes or other foot wear which appear somewhat moldy should be discarded.   Use the topical Nizoral daily and blow dry with hairdryer. Continue this for 14 days or for 7 days after the rash resolves. If the rash persists then fill the Diflucan to be taken once weekly

## 2011-07-29 NOTE — Progress Notes (Signed)
  Subjective:    Patient ID: Juan Ford, male    DOB: 1951-01-21, 61 y.o.   MRN: [id number]  HPI  On 07/25/11 he incidentally noted a rash over the posterior neck after showering. This was slightly raised and itched  minimally.  Lamisil and ketoconazole topically one to 2 times a day may have resulted in some improvement. The latter medication is one has on hand for his rosacea.  He denies fever, chills, sweats, or weight loss.  He's had no exposure  to ticks, pets, or infected children.    Review of Systems  He's not had any GI symptoms, specifically no diarrhea.     Objective:   Physical Exam He is thin but healthy and well-nourished in appearance  No scleral icterus  He has no lymphadenopathy about the neck or axilla.  He has no organomegaly masses.  There is a 3.5 cm faintly pink, serpiginous rash with  slight elevation at the right posterior base of the neck  There are chronic, gross deformities of the toenails, especially the large toenails          Assessment & Plan:  #1 tinea corporis. According to [employer] reference source denies really topically once a day for 2 weeks should suffice. He's only been employing this for< one week. If that fails to clear the lesion or if new lesions appear; Diflucan 150 mg once weekly would be given for 2-6 weeks.

## 2011-08-11 ENCOUNTER — Encounter: Payer: Self-pay | Admitting: Family Medicine

## 2011-08-11 ENCOUNTER — Ambulatory Visit (INDEPENDENT_AMBULATORY_CARE_PROVIDER_SITE_OTHER): Payer: BC Managed Care – PPO | Admitting: Family Medicine

## 2011-08-11 VITALS — BP 114/70 | HR 56 | Temp 98.2°F | Wt 172.0 lb

## 2011-08-11 DIAGNOSIS — J329 Chronic sinusitis, unspecified: Secondary | ICD-10-CM

## 2011-08-11 MED ORDER — CEFUROXIME AXETIL 500 MG PO TABS: 500.0000 mg | ORAL_TABLET | Freq: Two times a day (BID) | ORAL | Status: AC

## 2011-08-11 MED ORDER — MOMETASONE FUROATE 50 MCG/ACT NA SUSP: 2.0000 | Freq: Every day | NASAL | Status: DC

## 2011-08-11 NOTE — Patient Instructions (Signed)

## 2011-08-11 NOTE — Progress Notes (Signed)
  Subjective:     Juan Ford is a 61 y.o. male who presents for evaluation of sinus pain. Symptoms include: congestion, cough, nasal congestion and post nasal drip. Onset of symptoms was 4 days ago. Symptoms have been gradually worsening since that time. Past history is significant for no history of pneumonia or bronchitis. Patient is a non-smoker.  The following portions of the patient's history were reviewed and updated as appropriate: allergies, current medications, past family history, past medical history, past social history, past surgical history and problem list.  Review of Systems Pertinent items are noted in HPI.   Objective:    BP 114/70  Pulse 56  Temp(Src) 98.2 F (36.8 C) (Oral)  Wt 172 lb (78.019 kg)  SpO2 98% General appearance: alert, cooperative, appears stated age and no distress Ears: normal TM's and external ear canals both ears Nose: green discharge, moderate congestion, turbinates red, swollen, sinus tenderness bilateral Throat: lips, mucosa, and tongue normal; teeth and gums normal Neck: no adenopathy and thyroid not enlarged, symmetric, no tenderness/mass/nodules Lungs: clear to auscultation bilaterally    Assessment:    Acute bacterial sinusitis.    Plan:    Neti pot recommended. Instructions given. Nasal steroids per medication orders. Antihistamines per medication orders. Ceftin per medication orders. f/u prn

## 2011-08-16 ENCOUNTER — Telehealth: Payer: Self-pay | Admitting: Oncology

## 2011-08-16 NOTE — Telephone Encounter (Signed)
pt called to r/s june appts to sept   aom

## 2011-08-24 ENCOUNTER — Telehealth: Payer: Self-pay | Admitting: Internal Medicine

## 2011-08-24 ENCOUNTER — Ambulatory Visit (INDEPENDENT_AMBULATORY_CARE_PROVIDER_SITE_OTHER): Payer: BC Managed Care – PPO | Admitting: Internal Medicine

## 2011-08-24 VITALS — BP 116/78 | HR 72

## 2011-08-24 DIAGNOSIS — J0100 Acute maxillary sinusitis, unspecified: Secondary | ICD-10-CM

## 2011-08-24 MED ORDER — METRONIDAZOLE 500 MG PO TABS: 500.0000 mg | ORAL_TABLET | Freq: Three times a day (TID) | ORAL | Status: AC

## 2011-08-24 NOTE — Progress Notes (Signed)
  Subjective:    Patient ID: Juan Ford, male    DOB: [birth year], 61 y.o.   MRN: [id number]  HPI He finished his cefuroxime with resolution of symptoms. The 08/11/11 the office visit was reviewed. On 15/20 he developed some malaise and facial pressure. He noted some intermittent dark nasal secretions which has been progressive. Cough has been productive of similar material in the context of postnasal drainage.  His wife recently had respiratory tract infection as well which required 2 courses of antibiotics.    Review of Systems he has not had significant frontal headache, facial pain, sore throat, dental pain, pelvic pain or discharge. He is also not had significant extrinsic symptoms of itchy eyes or sneezing     Objective:   Physical Exam General appearance:good health ;well nourished; no acute distress or increased work of breathing is present.  No  lymphadenopathy about the head, neck, or axilla noted.   Eyes: No conjunctival inflammation or lid edema is present.   Ears:  External ear exam shows no significant lesions or deformities.  Otoscopic examination reveals clear canals, tympanic membranes are intact bilaterally without bulging, retraction, inflammation or discharge.  Nose:  External nasal examination shows no deformity or inflammation. Nasal mucosa are pink and moist without lesions or exudates. No septal dislocation or deviation.No obstruction to airflow.   Oral exam: Dental hygiene is good; lips and gums are healthy appearing.There is no oropharyngeal erythema or exudate noted.    Heart:  Normal rate and regular rhythm. S1 and S2 normal without gallop, murmur, click, rub or other extra sounds.   Lungs:Chest clear to auscultation; no wheezes, rhonchi,rales ,or rubs present.No increased work of breathing.    Extremities:  No cyanosis, edema, or clubbing  noted    Skin: Warm & dry           Assessment & Plan:  #1 rhinosinusitis without significant  bronchitis  Plan: Nasal hygiene interventions discussed. See prescription medications

## 2011-08-24 NOTE — Telephone Encounter (Signed)
Caller: Juan Ford/Patient; PCP: Marga Melnick; CB#: [phone]; ; ; Call regarding Cough/Congestion;   Pt had OV ~ 2 weeks ago and dx with sinus infection  and took  antibiotic and sx's  improved.  Since 08/22/2011 having sinus congestion again and cough that has produced green colored sputum. No  fever. RN reached See in 24 hrs for productice cough with colored sputum  per Cough protocol and Appt sched for 1430 with Dr. Alwyn Ren today

## 2011-08-24 NOTE — Patient Instructions (Signed)
Plain Mucinex for thick secretions ;force NON dairy fluids . Use a Neti pot daily as needed for sinus congestion; going from open side to congested side . Nasal cleansing in the shower as discussed. Make sure that all residual soap is removed to prevent irritation. Nasonex 1 spray in each nostril twice a day as needed. Use the "crossover" technique as discussed. Plain Allegra 160 daily as needed for itchy eyes & sneezing.    

## 2011-08-30 ENCOUNTER — Encounter: Payer: Self-pay | Admitting: Internal Medicine

## 2011-09-06 ENCOUNTER — Other Ambulatory Visit (INDEPENDENT_AMBULATORY_CARE_PROVIDER_SITE_OTHER): Payer: BC Managed Care – PPO

## 2011-09-06 DIAGNOSIS — E785 Hyperlipidemia, unspecified: Secondary | ICD-10-CM

## 2011-09-06 DIAGNOSIS — C090 Malignant neoplasm of tonsillar fossa: Secondary | ICD-10-CM

## 2011-09-06 DIAGNOSIS — Z0000 Encounter for general adult medical examination without abnormal findings: Secondary | ICD-10-CM

## 2011-09-06 DIAGNOSIS — E039 Hypothyroidism, unspecified: Secondary | ICD-10-CM

## 2011-09-06 DIAGNOSIS — T887XXA Unspecified adverse effect of drug or medicament, initial encounter: Secondary | ICD-10-CM

## 2011-09-06 LAB — HEPATIC FUNCTION PANEL
ALT: 21 U/L (ref 0–53)
Albumin: 3.8 g/dL (ref 3.5–5.2)
Alkaline Phosphatase: 42 U/L (ref 39–117)
Total Bilirubin: 0.6 mg/dL (ref 0.3–1.2)
Total Protein: 6.7 g/dL (ref 6.0–8.3)

## 2011-09-06 LAB — CBC WITH DIFFERENTIAL/PLATELET
Basophils Relative: 1.3 % (ref 0.0–3.0)
Eosinophils Relative: 4.9 % (ref 0.0–5.0)
HCT: 42.4 % (ref 39.0–52.0)
Hemoglobin: 14.3 g/dL (ref 13.0–17.0)
Lymphs Abs: 0.9 10*3/uL (ref 0.7–4.0)
MCV: 98.2 fl (ref 78.0–100.0)
Monocytes Absolute: 0.4 10*3/uL (ref 0.1–1.0)
RBC: 4.32 Mil/uL (ref 4.22–5.81)
WBC: 3.7 10*3/uL — ABNORMAL LOW (ref 4.5–10.5)

## 2011-09-06 LAB — LIPID PANEL
HDL: 66.9 mg/dL (ref >39.00)
Total CHOL/HDL Ratio: 3

## 2011-09-06 LAB — BASIC METABOLIC PANEL
BUN: 20 mg/dL (ref 6–23)
Creatinine, Ser: 1.2 mg/dL (ref 0.4–1.5)
GFR: 68.47 mL/min (ref >60.00)
Potassium: 4.1 mEq/L (ref 3.5–5.1)

## 2011-09-06 NOTE — Progress Notes (Signed)
Labs only

## 2011-09-13 ENCOUNTER — Encounter (INDEPENDENT_AMBULATORY_CARE_PROVIDER_SITE_OTHER): Payer: BC Managed Care – PPO | Admitting: Internal Medicine

## 2011-09-13 ENCOUNTER — Encounter: Payer: Self-pay | Admitting: Internal Medicine

## 2011-09-13 ENCOUNTER — Telehealth: Payer: Self-pay | Admitting: *Deleted

## 2011-09-13 NOTE — Telephone Encounter (Signed)
Pt states that he recently tried to get individual coverage for insurance and was denied due to: abnormal heart rhythm or palpitations, abnormal lipids including elevated cholesterol or triglycerides, cancer/malignancy,including melanoma, of native coronary artery ([facility]-06-04-08). Pt indicated that he understand all reason except for the last one and would like for you to explain this to him. Pt notes that you can responds to him via Mychart.Please advise

## 2011-09-13 NOTE — Telephone Encounter (Signed)
He underwent catheter radiofrequency ablation in 2010 by Dr. Graciela Husbands for supraventricular tachycardia. The problem was resolved. He has no history of coronary disease. Family history is negative for premature coronary disease. His father did have heart failure. To address this; unfortunately he may need to see Dr. Graciela Husbands for definitive cardiac statement. Otherwise we can address it at the time of his physical later this month.

## 2011-09-13 NOTE — Telephone Encounter (Signed)
MyChart message sent to patient.  Advised in message to call me with any questions.

## 2011-09-16 ENCOUNTER — Other Ambulatory Visit: Payer: BC Managed Care – PPO | Admitting: Lab

## 2011-09-16 ENCOUNTER — Ambulatory Visit: Payer: BC Managed Care – PPO | Admitting: Oncology

## 2011-09-30 ENCOUNTER — Encounter: Payer: BC Managed Care – PPO | Admitting: Internal Medicine

## 2011-10-03 ENCOUNTER — Encounter: Payer: Self-pay | Admitting: Internal Medicine

## 2011-10-03 ENCOUNTER — Ambulatory Visit (INDEPENDENT_AMBULATORY_CARE_PROVIDER_SITE_OTHER): Payer: BC Managed Care – PPO | Admitting: Internal Medicine

## 2011-10-03 VITALS — BP 118/80 | HR 57 | Temp 98.2°F | Resp 12 | Ht 69.08 in | Wt 170.8 lb

## 2011-10-03 DIAGNOSIS — Z0000 Encounter for general adult medical examination without abnormal findings: Secondary | ICD-10-CM

## 2011-10-03 DIAGNOSIS — E039 Hypothyroidism, unspecified: Secondary | ICD-10-CM

## 2011-10-03 MED ORDER — LEVOTHYROXINE SODIUM 100 MCG PO TABS: 100.0000 ug | ORAL_TABLET | Freq: Every day | ORAL | Status: DC

## 2011-10-03 NOTE — Patient Instructions (Addendum)
Preventive Health Care: Exercise at least 30-45 minutes a day,  3-4 days a week.  Eat a low-fat diet with lots of fruits and vegetables, up to 7-9 servings per day.  

## 2011-10-03 NOTE — Progress Notes (Signed)
  Subjective:    Patient ID: Juan Ford, male    DOB: 1951/03/02, 61 y.o.   MRN: [id number]  HPI  Juan Ford is here for a physical; he has no acute issues.      Review of Systems   Constitutional: Weight change: stable; Fatigue:no; Sleep pattern:good; Appetite:good  Visual change(blurred/diplopia/visual loss):no Hoarseness:no; Swallowing issues:no Cardiovascular: Palpitations:no; Racing:no; Irregularity:no GI: Constipation:no; Diarrhea:no Derm: Change in nails/hair/skin:no Neuro: Numbness/tingling:no; Tremor:no Psych: Anxiety:no; Depression:no Endo: Temperature intolerance: Heat:no; Cold:no       Objective:   Physical Exam  Gen.: Healthy and well-nourished in appearance. Alert, appropriate and cooperative throughout exam. Head: Normocephalic without obvious abnormalities;  no alopecia  Eyes: No corneal or conjunctival inflammation noted. Pupils equal round reactive to light and accommodation. Fundal exam is benign without hemorrhages, exudate, papilledema. Extraocular motion intact. Vision grossly normal. Ears: External  ear exam reveals no significant lesions or deformities. Canals clear .TMs normal. Hearing is grossly normal bilaterally. Nose: External nasal exam reveals no deformity or inflammation. Nasal mucosa are pink and moist. No lesions or exudates noted.   Mouth: Oral mucosa and oropharynx reveal no lesions or exudates. Teeth in good repair. Neck: No deformities, masses, or tenderness noted. Range of motion & Thyroid normal Lungs: Normal respiratory effort; chest expands symmetrically. Lungs are clear to auscultation without rales, wheezes, or increased work of breathing. Heart: Normal rate and rhythm. Normal S1 and S2. No gallop,  or rub. Apical click w/o  murmur. Abdomen: Bowel sounds normal; abdomen soft and nontender. No masses, organomegaly or hernias noted. Genitalia/ DRE: Genitalia normal except for left varices Prostate is normal without enlargement, asymmetry,  nodularity, or induration.   Musculoskeletal/extremities: No deformity or scoliosis noted of  the thoracic or lumbar spine. No clubbing, cyanosis, edema noted. Range of motion decreased @ knees .Tone & strength  Normal.Joints: OA hand changes . Nail health  good. Vascular: Carotid, radial artery, dorsalis pedis and  posterior tibial pulses are full and equal. No bruits present. Neurologic: Alert and oriented x3. Deep tendon reflexes symmetrical and normal.          Skin: Intact without suspicious lesions or rashes. Lymph: No cervical, axillary, or inguinal lymphadenopathy present. Psych: Mood and affect are normal. Normally interactive                                                                                         Assessment & Plan:  #1 comprehensive physical exam; no acute findings #2 see Problem List with Assessments & Recommendations Plan: see Orders

## 2011-12-16 ENCOUNTER — Encounter: Payer: Self-pay | Admitting: Cardiovascular Disease

## 2011-12-22 ENCOUNTER — Other Ambulatory Visit: Payer: Self-pay | Admitting: Oncology

## 2011-12-22 ENCOUNTER — Ambulatory Visit (HOSPITAL_BASED_OUTPATIENT_CLINIC_OR_DEPARTMENT_OTHER): Payer: BC Managed Care – PPO | Admitting: Oncology

## 2011-12-22 ENCOUNTER — Other Ambulatory Visit (HOSPITAL_BASED_OUTPATIENT_CLINIC_OR_DEPARTMENT_OTHER): Payer: BC Managed Care – PPO | Admitting: Lab

## 2011-12-22 ENCOUNTER — Telehealth: Payer: Self-pay | Admitting: Oncology

## 2011-12-22 VITALS — BP 128/80 | HR 61 | Temp 96.8°F | Resp 20 | Ht 69.0 in | Wt 171.4 lb

## 2011-12-22 DIAGNOSIS — C099 Malignant neoplasm of tonsil, unspecified: Secondary | ICD-10-CM

## 2011-12-22 DIAGNOSIS — C090 Malignant neoplasm of tonsillar fossa: Secondary | ICD-10-CM

## 2011-12-22 DIAGNOSIS — E039 Hypothyroidism, unspecified: Secondary | ICD-10-CM

## 2011-12-22 DIAGNOSIS — E785 Hyperlipidemia, unspecified: Secondary | ICD-10-CM

## 2011-12-22 LAB — CBC WITH DIFFERENTIAL/PLATELET
Basophils Absolute: 0 10*3/uL (ref 0.0–0.1)
Eosinophils Absolute: 0.2 10*3/uL (ref 0.0–0.5)
HCT: 42.3 % (ref 38.4–49.9)
HGB: 14.6 g/dL (ref 13.0–17.1)
LYMPH%: 32.8 % (ref 14.0–49.0)
MCHC: 34.5 g/dL (ref 32.0–36.0)
MONO#: 0.4 10*3/uL (ref 0.1–0.9)
NEUT#: 1.6 10*3/uL (ref 1.5–6.5)
NEUT%: 46.3 % (ref 39.0–75.0)
Platelets: 153 10*3/uL (ref 140–400)
WBC: 3.5 10*3/uL — ABNORMAL LOW (ref 4.0–10.3)
lymph#: 1.2 10*3/uL (ref 0.9–3.3)

## 2011-12-22 LAB — COMPREHENSIVE METABOLIC PANEL (CC13)
ALT: 18 U/L (ref 0–55)
AST: 20 U/L (ref 5–34)
Alkaline Phosphatase: 55 U/L (ref 40–150)
CO2: 24 mEq/L (ref 22–29)
Creatinine: 1.4 mg/dL — ABNORMAL HIGH (ref 0.7–1.3)
Sodium: 141 mEq/L (ref 136–145)
Total Bilirubin: 0.6 mg/dL (ref 0.20–1.20)

## 2011-12-22 LAB — TSH: TSH: 6.33 u[IU]/mL — ABNORMAL HIGH (ref 0.350–4.500)

## 2011-12-22 NOTE — Telephone Encounter (Signed)
appts made and printed for pt aom °

## 2011-12-22 NOTE — Progress Notes (Signed)
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   PAST THERAPY: Definitive concurrent chemoradiation therapy with cisplatin once every 3 weeks. 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 His last dose of radiation was 08/05/2008.   CURRENT THERAPY: watchful observation.   INTERVAL HISTORY: Juan Ford 61 y.o. male returns for regular follow up by himself. He reports feeling well. He still has tinnitus without hearing loss. He still works full time. He denies any dysphagia, odynophagia, xerostomia, mucositis, neck swelling.  Patient denies fever, anorexia, weight loss, fatigue, headache, visual changes, confusion, drenching night sweats, palpable lymph node swelling, mucositis, odynophagia, dysphagia, nausea vomiting, jaundice, chest pain, palpitation, shortness of breath, dyspnea on exertion, productive cough, gum bleeding, epistaxis, hematemesis, hemoptysis, abdominal pain, abdominal swelling, early satiety, melena, hematochezia, hematuria, skin rash, spontaneous bleeding, joint swelling, joint pain, heat or cold intolerance, bowel bladder incontinence, back pain, focal motor weakness, paresthesia, depression, suicidal or homicidal ideation, feeling hopelessness.   Past Medical History  Diagnosis Date  . Hyperlipidemia   . Cancer 05/2008    tonsillar, Dr Jenne Pane  . HYPOTHYROIDISM 03/23/2009  . HYPERLIPIDEMIA 08/10/2007  . 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 Knee arthroscopy 2002    L  . Colonoscopy 08/2002, 2009    Negative, 2019  . Knee surgery     L age 64; R @ 60  . Catheter radiofrequency ablation 06/2009    Dr Graciela Husbands  . Tonsillectomy 05/2008    Dr Jenne Pane    Current Outpatient Prescriptions   Medication Sig Dispense Refill  . aspirin 81 MG tablet Take 81 mg by mouth daily.        Marland Kitchen atorvastatin (LIPITOR) 20 MG tablet Take 1 tablet (20 mg total) by mouth as directed.  90 tablet  1  . levothyroxine (SYNTHROID) 100 MCG tablet Take 1 tablet (100 mcg total) by mouth daily.  90 tablet  3  . Omega-3 Fatty Acids (FISH OIL) 300 MG CAPS Take by mouth.          ALLERGIES:  is allergic to sulfonamide derivatives.  REVIEW OF SYSTEMS:  The rest of the 14-point review of system was negative.   Filed Vitals:   12/22/11 0826  BP: 128/80  Pulse: 61  Temp: 96.8 F (36 C)  Resp: 20   Wt Readings from Last 3 Encounters:  12/22/11 171 lb 6.4 oz (77.747 kg)  10/03/11 170 lb 12.8 oz (77.474 kg)  08/11/11 172 lb (78.019 kg)   ECOG Performance status: 0  PHYSICAL EXAMINATION:   General:  well-nourished man, in no acute distress.  Eyes:  no scleral icterus.  ENT:  There were no oropharyngeal lesions.  Neck was without thyromegaly.  Lymphatics:  Negative cervical, supraclavicular or axillary adenopathy.  Respiratory: lungs were clear bilaterally without wheezing or crackles.  Cardiovascular:  Regular rate and rhythm, S1/S2, without murmur, rub or gallop.  There was no pedal edema.  GI:  abdomen was soft, flat, nontender, nondistended, without organomegaly.  Muscoloskeletal:  no spinal tenderness of palpation of vertebral spine.  Skin exam was without echymosis, petichae.  Neuro exam was nonfocal.  Patient was able to get on and off exam table without assistance.  Gait was normal.  Patient was alerted and oriented.  Attention was good.   Language was appropriate.  Mood was normal without depression.  Speech was not pressured.  Thought content was not tangential.     LABORATORY/RADIOLOGY DATA:  Lab Results  Component Value Date   WBC 3.5* 12/22/2011   HGB 14.6 12/22/2011   HCT 42.3 12/22/2011   PLT 153 12/22/2011   GLUCOSE 102* 12/22/2011   CHOL 191 09/06/2011   TRIG 78.0 09/06/2011   HDL 66.90  09/06/2011   LDLDIRECT 105.0 09/24/2010   LDLCALC 109* 09/06/2011   ALKPHOS 55 12/22/2011   ALT 18 12/22/2011   AST 20 12/22/2011   NA 141 12/22/2011   K 4.6 12/22/2011   CL 104 12/22/2011   CREATININE 1.4* 12/22/2011   BUN 17.0 12/22/2011   CO2 24 12/22/2011   PSA 2.92 09/24/2010   INR 1.2 ratio* 06/08/2009   HGBA1C 5.6 10/31/2006     ASSESSMENT AND PLAN:    1. History of oropharyngeal squamous cell carcinoma: There was no evidence of recurrence or metastases on today's clinical history, physical exam, lab test. As he is 2 years out from the finish of therapy, and he is asymptomatic, there is no indication for routine surveillance CT scan.I discussed this with Mr. Mclees he agreed with the recommendation. 2. Xerostomia: Much improved compared to before. There is no indication for intervention. 3. Hyperlipidemia: He is on atorvastatin and fish oil per PCP. 4. Hypothyroidism: He is on levothyroxine. His TSH is pending today. I will contact patient if this does need to be changed. 5. Follow up with me in about 12 months but sooner if he has any concerning symptoms. I advised him also to follow ENT at least once a year.   The length of time of the face-to-face encounter was minutes. More than 50% of time was spent counseling and coordination of care.

## 2011-12-22 NOTE — Patient Instructions (Addendum)
1.  Diagnosis:  History of head and neck cancer.  2.  Status:  No evidence of disease on clinical history and physical exam.  There is no sign and symptoms to warrant routine CT scan. 3.  Follow up:  Please make appointment with your Ear/Nose/Throat physician and see him at least once a year.   Return to [facility] in about 1 year.

## 2011-12-23 ENCOUNTER — Encounter: Payer: Self-pay | Admitting: Internal Medicine

## 2011-12-26 ENCOUNTER — Encounter: Payer: Self-pay | Admitting: Internal Medicine

## 2012-01-07 ENCOUNTER — Encounter: Payer: Self-pay | Admitting: Internal Medicine

## 2012-01-09 ENCOUNTER — Encounter: Payer: Self-pay | Admitting: Internal Medicine

## 2012-03-06 ENCOUNTER — Other Ambulatory Visit (INDEPENDENT_AMBULATORY_CARE_PROVIDER_SITE_OTHER): Payer: BC Managed Care – PPO

## 2012-03-06 DIAGNOSIS — E039 Hypothyroidism, unspecified: Secondary | ICD-10-CM

## 2012-03-06 LAB — TSH: TSH: 3.88 u[IU]/mL (ref 0.35–5.50)

## 2012-03-06 LAB — BUN: BUN: 27 mg/dL — ABNORMAL HIGH (ref 6–23)

## 2012-05-19 ENCOUNTER — Other Ambulatory Visit: Payer: Self-pay

## 2012-05-21 ENCOUNTER — Telehealth: Payer: Self-pay | Admitting: Internal Medicine

## 2012-05-21 NOTE — Telephone Encounter (Signed)
Per pts request via my chart see below please:  Appointment Request From: Juan Ford With Provider: Marga Melnick, MD [-Primary Care Physician-] Preferred Date Range: Any date 05/19/2012 or later Preferred Times: Any Reason: To address the following health maintenance concerns. Influenza Vaccine Comments: Hello-I had a flu shot November 2013 through my wife's [employer] shot day at [facility]. Thanks, [name]

## 2012-05-22 NOTE — Telephone Encounter (Signed)
Flu vaccine noted

## 2012-06-04 ENCOUNTER — Ambulatory Visit (INDEPENDENT_AMBULATORY_CARE_PROVIDER_SITE_OTHER): Payer: BC Managed Care – PPO | Admitting: Internal Medicine

## 2012-06-04 ENCOUNTER — Encounter: Payer: Self-pay | Admitting: Internal Medicine

## 2012-06-04 VITALS — BP 136/88 | HR 50 | Wt 178.2 lb

## 2012-06-04 DIAGNOSIS — R609 Edema, unspecified: Secondary | ICD-10-CM

## 2012-06-04 NOTE — Progress Notes (Signed)
Subjective:    Patient ID: Juan Ford, male    DOB: Jan 05, 1951, 62 y.o.   MRN: [id number]  HPI Edema began 05/27/12 in the LLE from knee down  without associated injury or trigger . Edema began 1 day after flight 2/22to [place]  .  Associated signs/symptoms include L knee swelling w/o skin color change or temperature change The edema was treated with NSAIDS & elevation ; edema resolved almost completely overnight.It recurred with ambulation.  In the Fall of 2013 he had a partial tear of the right gastrocnemius for which she wore a compression device. He has worn this for the edema with some response.                                                                                      Review of Systems Constitutional: no fever, chills, sweats, change in weight  Musculoskeletal:no  muscle cramps or pain; no  joint redness.  Cardiopulmonary: No chest pain, palpitations, dyspnea, or paroxysmal nocturnal dyspnea.   Skin:no rash Neuro: no weakness; incontinence (stool/urine)Some tingling LLE upon descent on return flight. Heme:no lymphadenopathy; abnormal bruising or bleeding       Objective:   Physical Exam Gen.: Healthy and well-nourished in appearance. Alert, appropriate and cooperative throughout exam. Eyes: No corneal or conjunctival inflammation noted. No icterus Lungs: Normal respiratory effort; chest expands symmetrically. Lungs are clear to auscultation without rales, wheezes, or increased work of breathing. Neck: No JVD Heart: Normal rate and rhythm. Normal S1 and S2. No gallop,  or rub. Intermittent click @ apex; no murmur.Occasional premature. Abdomen: Bowel sounds normal; abdomen soft and nontender. No masses, organomegaly or hernias noted.No HJR.                              Musculoskeletal/extremities:  No clubbing, cyanosis, edema, or significant extremity  deformity noted. Range of motion normal .Tone & strength  Normal. Crepitus left knee; I cannot appreciate any  effusion. Homans sign negative bilaterally. Circumference of the mid calf 15.5 cm below the inferior patellar margin was 36.5 cm bilaterally. No tenderness to palpation in the left popliteal space. Chronic thickening of the great toenails Able to lie down & sit up w/o help. Negative SLR bilaterally Vascular: Carotid, radial artery, dorsalis pedis and  posterior tibial pulses are full and equal. No bruits present. Neurologic: Alert and oriented x3.  Skin: Intact without suspicious lesions or rashes. Lymph: No cervical, axillary, or inguinal lymphadenopathy present. Psych: Mood and affect are normal. Normally interactive                                                                                     Assessment & Plan:  #1 left lower extremity edema which has been markedly responsive to elevation and compression garment. This is  in the context of significant air travel.  Plan: D-dimer; venous Doppler

## 2012-06-04 NOTE — Patient Instructions (Addendum)
The Venous Doppler will be scheduled to rule out clots in legs.

## 2012-06-05 ENCOUNTER — Encounter (INDEPENDENT_AMBULATORY_CARE_PROVIDER_SITE_OTHER): Payer: BC Managed Care – PPO

## 2012-06-05 DIAGNOSIS — M7989 Other specified soft tissue disorders: Secondary | ICD-10-CM

## 2012-06-06 ENCOUNTER — Encounter: Payer: Self-pay | Admitting: Internal Medicine

## 2012-07-01 ENCOUNTER — Encounter: Payer: Self-pay | Admitting: Internal Medicine

## 2012-07-30 ENCOUNTER — Encounter: Payer: Self-pay | Admitting: Internal Medicine

## 2012-08-01 ENCOUNTER — Encounter: Payer: Self-pay | Admitting: Internal Medicine

## 2012-08-07 ENCOUNTER — Encounter: Payer: Self-pay | Admitting: Internal Medicine

## 2012-08-07 ENCOUNTER — Ambulatory Visit (INDEPENDENT_AMBULATORY_CARE_PROVIDER_SITE_OTHER): Admitting: Internal Medicine

## 2012-08-07 VITALS — BP 124/80 | HR 56 | Temp 97.8°F | Resp 12 | Ht 69.08 in | Wt 170.0 lb

## 2012-08-07 DIAGNOSIS — Z0000 Encounter for general adult medical examination without abnormal findings: Secondary | ICD-10-CM

## 2012-08-07 DIAGNOSIS — E039 Hypothyroidism, unspecified: Secondary | ICD-10-CM

## 2012-08-07 DIAGNOSIS — E785 Hyperlipidemia, unspecified: Secondary | ICD-10-CM

## 2012-08-07 LAB — CBC WITH DIFFERENTIAL/PLATELET
Eosinophils Relative: 3 % (ref 0.0–5.0)
HCT: 42.6 % (ref 39.0–52.0)
Hemoglobin: 14.7 g/dL (ref 13.0–17.0)
Lymphocytes Relative: 24.2 % (ref 12.0–46.0)
Lymphs Abs: 0.8 10*3/uL (ref 0.7–4.0)
Monocytes Relative: 13 % — ABNORMAL HIGH (ref 3.0–12.0)
Neutro Abs: 1.9 10*3/uL (ref 1.4–7.7)
Platelets: 176 10*3/uL (ref 150.0–400.0)
WBC: 3.2 10*3/uL — ABNORMAL LOW (ref 4.5–10.5)

## 2012-08-07 LAB — BASIC METABOLIC PANEL
Calcium: 9.1 mg/dL (ref 8.4–10.5)
GFR: 59.32 mL/min — ABNORMAL LOW (ref >60.00)
Potassium: 4.3 mEq/L (ref 3.5–5.1)
Sodium: 138 mEq/L (ref 135–145)

## 2012-08-07 LAB — HEPATIC FUNCTION PANEL
ALT: 20 U/L (ref 0–53)
AST: 23 U/L (ref 0–37)
Alkaline Phosphatase: 45 U/L (ref 39–117)
Total Bilirubin: 1.1 mg/dL (ref 0.3–1.2)

## 2012-08-07 LAB — LIPID PANEL
Cholesterol: 221 mg/dL — ABNORMAL HIGH (ref 0–200)
Total CHOL/HDL Ratio: 2
VLDL: 7.8 mg/dL (ref 0.0–40.0)

## 2012-08-07 LAB — TSH: TSH: 2.48 u[IU]/mL (ref 0.35–5.50)

## 2012-08-07 NOTE — Patient Instructions (Addendum)
Please review the record and make any corrections; share this with all medical staff seen. If you note any significant issues please contact us through My Chart

## 2012-08-07 NOTE — Progress Notes (Signed)
Subjective:    Patient ID: Juan Ford, male    DOB: April 08, 1950, 62 y.o.   MRN: [id number]  HPI  He is here for a physical;acute issues include swelling in sole of L foot &  @ L lateral knee.     Review of Systems In March of this year he developed swelling in the left lower extremity from the down after air travel. Venous Doppler was negative for deep vein thrombosis. The edema resolved with support hose. He denies any chest pain, palpitations, dyspnea, ankle edema, paroxysmal nocturnal dyspnea.  Also in March he developed dull, constant left flank/waist pain over several weeks. This resolved one night after an episode of sharp pain in this area with urination at night. He did not visualize the urine. She denies dysuria, hematuria, or pyuria. He has no bowel changes constipation or diarrhea, melena, rectal bleeding. Colonoscopy is up-to-date  Since that time he's had some recurrence of the L. discomfort in the left flank     Objective:   Physical Exam  Gen.: Thin but healthy and well-nourished in appearance. Alert, appropriate and cooperative throughout exam.Appears younger than stated age  Head: Normocephalic without obvious abnormalities; no alopecia  Eyes: No corneal or conjunctival inflammation noted. Pupils equal round reactive to light and accommodation. Fundal exam is benign without hemorrhages, exudate, papilledema. Extraocular motion intact. Vision grossly normal with lenses Ears: External  ear exam reveals no significant lesions or deformities. Canals clear .TMs normal. Hearing is grossly normal bilaterally. Nose: External nasal exam reveals no deformity or inflammation. Nasal mucosa are pink and moist. No lesions or exudates noted.   Mouth: Oral mucosa and oropharynx reveal no lesions or exudates. Teeth in good repair. Neck: No deformities, masses, or tenderness noted.Some fibrotic SQ changes on R. Range of motion & Thyroid normal. Lungs: Normal respiratory effort; chest  expands symmetrically. Lungs are clear to auscultation without rales, wheezes, or increased work of breathing. Heart: Slow rate and regular rhythm. Accentuated S1 . No gallop, click, or rub. No murmur. Abdomen: Bowel sounds normal; abdomen soft and nontender. No masses, organomegaly or hernias noted. Genitalia: Genitalia normal except for small left varices. Prostate is not enlarged or asymmetric. No nodularity. Slight  Induration R lobe                  Musculoskeletal/extremities: No deformity or scoliosis noted of  the thoracic or lumbar spine.  No clubbing, cyanosis, edema, or significant extremity  deformity noted. Range of motion decreased @ knees.slightly fluctuant soft tissue change left lateral knee. Tone & strength  Normal. Joints  reveal minor  DJD DIP changes. Flexion contractures 5th digits. Chronic toe nail changes. Pes planus. No palpable lesion of the left sole. Able to lie down & sit up w/o help. Negative SLR bilaterally Vascular: Carotid, radial artery, dorsalis pedis and  posterior tibial pulses are full and equal. No bruits present. Neurologic: Alert and oriented x3. Deep tendon reflexes symmetrical and normal.         Skin: Intact without suspicious lesions or rashes. Lymph: No cervical, axillary, or inguinal lymphadenopathy present. Psych: Mood and affect are normal. Normally interactive  Assessment & Plan:  #1 comprehensive physical exam; no acute findings  #2 flank pain; rule out renal calculi  #3 swelling left knee appears to be related to the bursa  #4 tenderness left sole probably related to a plantar wart or neuroma which is not palpable  Plan: see Orders  & Recommendations

## 2012-08-08 ENCOUNTER — Encounter: Payer: Self-pay | Admitting: Internal Medicine

## 2012-08-08 LAB — POCT URINALYSIS DIPSTICK
Ketones, UA: NEGATIVE
Leukocytes, UA: NEGATIVE
Protein, UA: NEGATIVE
Urobilinogen, UA: 0.2
pH, UA: 6

## 2012-08-08 LAB — LDL CHOLESTEROL, DIRECT: Direct LDL: 121.4 mg/dL

## 2012-09-13 ENCOUNTER — Other Ambulatory Visit: Payer: Self-pay | Admitting: Internal Medicine

## 2012-09-13 ENCOUNTER — Encounter: Payer: Self-pay | Admitting: Internal Medicine

## 2012-09-14 MED ORDER — LEVOTHYROXINE SODIUM 100 MCG PO TABS: ORAL_TABLET | ORAL | Status: DC

## 2012-09-14 NOTE — Telephone Encounter (Signed)
Filled, duplicate notification

## 2012-09-14 NOTE — Telephone Encounter (Signed)
RX sent

## 2012-09-28 ENCOUNTER — Encounter: Payer: Self-pay | Admitting: Internal Medicine

## 2012-09-28 NOTE — Telephone Encounter (Addendum)
Labs and CPX faxed

## 2012-10-04 ENCOUNTER — Encounter: Payer: BC Managed Care – PPO | Admitting: Internal Medicine

## 2012-11-19 ENCOUNTER — Encounter: Payer: Self-pay | Admitting: Internal Medicine

## 2012-11-20 ENCOUNTER — Encounter: Payer: Self-pay | Admitting: Internal Medicine

## 2012-12-14 ENCOUNTER — Encounter: Payer: Self-pay | Admitting: Internal Medicine

## 2012-12-18 ENCOUNTER — Telehealth: Payer: Self-pay | Admitting: Hematology and Oncology

## 2012-12-18 NOTE — Telephone Encounter (Signed)
Moved 9/17 appt to 10/15 due [facility] out. S/w pt re change and per pt appt moved from 10/15 to 10/10 due to he is out of town. Pt will see NG and is aware.

## 2012-12-19 ENCOUNTER — Ambulatory Visit (INDEPENDENT_AMBULATORY_CARE_PROVIDER_SITE_OTHER)
Admission: RE | Admit: 2012-12-19 | Discharge: 2012-12-19 | Disposition: A | Discharge status: Home / Self Care | Source: Ambulatory Visit | Attending: Internal Medicine | Admitting: Internal Medicine

## 2012-12-19 ENCOUNTER — Ambulatory Visit (INDEPENDENT_AMBULATORY_CARE_PROVIDER_SITE_OTHER): Admitting: Internal Medicine

## 2012-12-19 ENCOUNTER — Other Ambulatory Visit: Payer: BC Managed Care – PPO | Admitting: Lab

## 2012-12-19 ENCOUNTER — Encounter: Payer: Self-pay | Admitting: Internal Medicine

## 2012-12-19 ENCOUNTER — Ambulatory Visit: Payer: BC Managed Care – PPO | Admitting: Oncology

## 2012-12-19 VITALS — BP 155/87 | HR 54 | Temp 97.8°F | Wt 177.0 lb

## 2012-12-19 DIAGNOSIS — G8929 Other chronic pain: Secondary | ICD-10-CM

## 2012-12-19 DIAGNOSIS — Q618 Other cystic kidney diseases: Secondary | ICD-10-CM

## 2012-12-19 DIAGNOSIS — R109 Unspecified abdominal pain: Secondary | ICD-10-CM

## 2012-12-19 DIAGNOSIS — N281 Cyst of kidney, acquired: Secondary | ICD-10-CM

## 2012-12-19 LAB — CBC WITH DIFFERENTIAL/PLATELET
Basophils Absolute: 0 10*3/uL (ref 0.0–0.1)
Eosinophils Absolute: 0.1 10*3/uL (ref 0.0–0.7)
HCT: 40.5 % (ref 39.0–52.0)
Hemoglobin: 13.6 g/dL (ref 13.0–17.0)
Lymphs Abs: 0.8 10*3/uL (ref 0.7–4.0)
MCHC: 33.5 g/dL (ref 30.0–36.0)
Monocytes Relative: 12.4 % — ABNORMAL HIGH (ref 3.0–12.0)
Neutro Abs: 1.9 10*3/uL (ref 1.4–7.7)
Platelets: 168 10*3/uL (ref 150.0–400.0)
RDW: 14.1 % (ref 11.5–14.6)

## 2012-12-19 LAB — POCT URINALYSIS DIPSTICK
Bilirubin, UA: NEGATIVE
Blood, UA: NEGATIVE
Leukocytes, UA: NEGATIVE
Nitrite, UA: NEGATIVE
Protein, UA: NEGATIVE
Urobilinogen, UA: 0.2
pH, UA: 6.5

## 2012-12-19 NOTE — Patient Instructions (Addendum)
Please obtain results of 7/14 CT scan report. Please complete and return stool cards; these will determine whether there is any gastrointestinalissues. Order for x-rays entered into  the computer; these will be performed at [address] [facility]. across from [facility]. No appointment is necessary.

## 2012-12-19 NOTE — Progress Notes (Signed)
  Subjective:    Patient ID: Juan Ford, male    DOB: 01-Nov-1950, 62 y.o.   MRN: [id number]  HPI  He has had left flank pain since March of this year. In March he had an isolated episode of "piercing" pain in the left flank. Prior to that time he had some discomfort in the left lower quadrant which resolved after this episode of piercing pain  Since that time he's had constant dull discomfort in the left flank up to a level I-2. There is been slight radiation superiorly.  On rare occasions he's had left testicular discomfort  The constant discomfort is worse with inactivity; especially noted in the mornings.  He was evaluated by  Dr Isabel Caprice, his Urologist 10/29/12; that note was reviewed. He has a history of complex cyst in the right kidney. On MRI this lesion was stable and felt to be a benign complex cyst. He was diagnosed with "renal colic". Dr Isabel Caprice questioned that he may have passed a stone but he felt that the constant discomfort would not be related to nephrolithiasis. Apparently a CT with contrast was done in July; history of revealed  no change in the right renal cyst and no process in the left kidney.  Colonoscopy was negative in 2009.    Review of Systems  He denies fever, chills, sweats, weight loss  He also denies dysuria, pyuria, or hematuria  He's had no constipation, diarrhea, melena, rectal bleeding.  There's been no rash or change in color or temperature of the skin in the area discomfort.  He denies any pain in the lower thoracic or lumbar spine with anterior radiation.  He also denies weakness, numbness, tingling in his legs.     Objective:   Physical Exam General appearance is one of good health and nourishment w/o distress.  Eyes: No conjunctival inflammation or scleral icterus is present.  Oral exam: Dental hygiene is good; lips and gums are healthy appearing.There is no oropharyngeal erythema or exudate noted. Slight asymmetry of oropharynx w/o  mass  Heart:  Slow rate and regular rhythm. S1 and S2 normal without gallop, murmur, click, rub or other extra sounds     Lungs:Chest clear to auscultation; no wheezes, rhonchi,rales ,or rubs present.No increased work of breathing.   Abdomen: bowel sounds normal, soft and non-tender without masses, organomegaly or hernias noted.  No guarding or rebound   Skin:Warm & dry.  Intact without suspicious lesions or rashes ; no jaundice or tenting  Lymphatic: No lymphadenopathy is noted about the head, neck, axilla, or inguinal areas.   Musculoskeletal/Extremities: Gait and station including heel and toe walking;  stability; muscle strength; and  muscle tone are normal. Nail health is good. There are mild DIP deformities of the digits. No cyanosis, clubbing or edema are present. He is able to lie flat and sit up without help. Straight leg raising is negative to 90. Some decreased range of motion of the knees.            Assessment & Plan:  #1 low-grade, chronic left flank pain since March 2014; negative urologic evaluation to include CT scan. History and physical do not suggest lumbar (L1) radiculopathy as etiology of pain. Lumbar films are indicated in view of the chronicity of his symptoms and the presence of some degenerative changes in his hands. CBC, stool cards, urinalysis will be repeated for completeness sake.  #2 complex right renal cyst, stable

## 2012-12-23 ENCOUNTER — Encounter: Payer: Self-pay | Admitting: Internal Medicine

## 2012-12-25 ENCOUNTER — Telehealth: Payer: Self-pay | Admitting: General Practice

## 2012-12-25 MED ORDER — LEVOTHYROXINE SODIUM 100 MCG PO TABS: ORAL_TABLET | ORAL | Status: AC

## 2012-12-25 NOTE — Telephone Encounter (Signed)
Spoke with pt's pharmacy [facility]. All prescriptions need to be faxed to [phone]. Refilled his levothyroxine for 1 year due to difficulties reaching [facility].

## 2013-01-02 ENCOUNTER — Encounter: Payer: Self-pay | Admitting: Internal Medicine

## 2013-01-04 LAB — POC HEMOCCULT BLD/STL (HOME/3-CARD/SCREEN)
Card #2 Fecal Occult Blod, POC: NEGATIVE
Card #3 Fecal Occult Blood, POC: NEGATIVE
Fecal Occult Blood, POC: NEGATIVE

## 2013-01-04 NOTE — Addendum Note (Signed)
Addended by: Silvio Pate D on: 01/04/2013 02:48 PM   Modules accepted: Orders

## 2013-01-04 NOTE — Telephone Encounter (Signed)
Juan Mayo do you have a stool cards for this patient, if so can you results them please so we can release the result.    Thanks    KP

## 2013-01-10 ENCOUNTER — Other Ambulatory Visit: Payer: Self-pay | Admitting: Hematology and Oncology

## 2013-01-10 DIAGNOSIS — C090 Malignant neoplasm of tonsillar fossa: Secondary | ICD-10-CM

## 2013-01-10 DIAGNOSIS — E039 Hypothyroidism, unspecified: Secondary | ICD-10-CM

## 2013-01-11 ENCOUNTER — Ambulatory Visit (HOSPITAL_BASED_OUTPATIENT_CLINIC_OR_DEPARTMENT_OTHER): Admitting: Hematology and Oncology

## 2013-01-11 ENCOUNTER — Other Ambulatory Visit (HOSPITAL_BASED_OUTPATIENT_CLINIC_OR_DEPARTMENT_OTHER): Admitting: Lab

## 2013-01-11 ENCOUNTER — Encounter: Payer: Self-pay | Admitting: Hematology and Oncology

## 2013-01-11 VITALS — BP 160/82 | HR 58 | Temp 97.5°F | Resp 20 | Ht 69.08 in | Wt 175.5 lb

## 2013-01-11 DIAGNOSIS — C090 Malignant neoplasm of tonsillar fossa: Secondary | ICD-10-CM

## 2013-01-11 DIAGNOSIS — B977 Papillomavirus as the cause of diseases classified elsewhere: Secondary | ICD-10-CM

## 2013-01-11 DIAGNOSIS — E039 Hypothyroidism, unspecified: Secondary | ICD-10-CM

## 2013-01-11 DIAGNOSIS — D72819 Decreased white blood cell count, unspecified: Secondary | ICD-10-CM

## 2013-01-11 LAB — CBC WITH DIFFERENTIAL/PLATELET
Basophils Absolute: 0 10*3/uL (ref 0.0–0.1)
EOS%: 4.7 % (ref 0.0–7.0)
Eosinophils Absolute: 0.1 10*3/uL (ref 0.0–0.5)
HCT: 41 % (ref 38.4–49.9)
HGB: 13.9 g/dL (ref 13.0–17.1)
MCH: 32 pg (ref 27.2–33.4)
MCV: 94.3 fL (ref 79.3–98.0)
MONO%: 13.3 % (ref 0.0–14.0)
NEUT%: 51.9 % (ref 39.0–75.0)
Platelets: 164 10*3/uL (ref 140–400)

## 2013-01-11 LAB — COMPREHENSIVE METABOLIC PANEL (CC13)
ALT: 15 U/L (ref 0–55)
AST: 20 U/L (ref 5–34)
Alkaline Phosphatase: 53 U/L (ref 40–150)
Chloride: 108 mEq/L (ref 98–109)
Creatinine: 1.5 mg/dL — ABNORMAL HIGH (ref 0.7–1.3)
Total Bilirubin: 0.63 mg/dL (ref 0.20–1.20)

## 2013-01-11 NOTE — Progress Notes (Signed)
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   ONCOLOGIC HISTORY: According to the patient, he was diagnosed in December 2009 at the present patient with a swelling in the right submandibular region that did not resolve with antibiotic therapy. He was subsequently referred to ENT specialist who performed a tonsillectomy that came back positive for involvement. He had definitive concurrent chemoradiation therapy with cisplatin once every 3 weeks. 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 His last dose of radiation was 08/05/2008.  INTERVAL HISTORY: Juan Ford 62 y.o. male returns for further followup related to the history above. His last imaging study was 2 years ago. He continue to see the ENT specialist on a routine basis and his last evaluation 6 months ago show no evidence of disease recurrence. History have very minimum dry mouth. His altered taste sensation has resolved. He is eating well and able to maintain his weight. He still had persistent tinnitus but no hearing deficits. No new palpable lymphadenopathy.  I have reviewed the past medical history, past surgical history, social history and family history with the patient and they are unchanged from previous note.  ALLERGIES:  is allergic to sulfonamide derivatives.  MEDICATIONS: Current outpatient prescriptions:aspirin 81 MG tablet, Take 81 mg by mouth daily.  , Disp: , Rfl: ;  atorvastatin (LIPITOR) 20 MG tablet, Take 20 mg by mouth as directed. 1 by mouth M/W/F, Disp: , Rfl: ;  levothyroxine (SYNTHROID, LEVOTHROID) 100 MCG tablet, 1 by mouth daily Take 100 mcg by mouth. 1 by mouth daily, Disp: 90 tablet, Rfl: 3;  Omega-3 Fatty Acids (FISH OIL) 300 MG CAPS, Take by mouth.  , Disp: , Rfl:   REVIEW OF SYSTEMS:   Constitutional: Denies fevers, chills or abnormal  weight loss Eyes: Denies blurriness of vision Ears, nose, mouth, throat, and face: Denies mucositis or sore throat Respiratory: Denies cough, dyspnea or wheezes Cardiovascular: Denies palpitation, chest discomfort or lower extremity swelling Gastrointestinal:  Denies nausea, heartburn or change in bowel habits Skin: Denies abnormal skin rashes Lymphatics: Denies new lymphadenopathy or easy bruising Neurological:Denies numbness, tingling or new weaknesses Behavioral/Psych: Mood is stable, no new changes  All other systems were reviewed with the patient and are negative.  PHYSICAL EXAMINATION: ECOG PERFORMANCE STATUS: 0 - Asymptomatic  Filed Vitals:   01/11/13 0836  BP: 160/82  Pulse: 58  Temp: 97.5 F (36.4 C)  Resp: 20   Filed Weights   01/11/13 0836  Weight: 175 lb 8 oz (79.606 kg)    GENERAL:alert, no distress and comfortable SKIN: skin color, texture, turgor are normal, no rashes or significant lesions EYES: normal, Conjunctiva are pink and non-injected, sclera clear OROPHARYNX:no exudate, no erythema and lips, buccal mucosa, and tongue normal  NECK: supple, thyroid normal size, non-tender, without nodularity LYMPH:  no palpable lymphadenopathy in the cervical, axillary or inguinal LUNGS: clear to auscultation and percussion with normal breathing effort HEART: regular rate & rhythm and no murmurs and no lower extremity edema ABDOMEN:abdomen soft, non-tender and normal bowel sounds Musculoskeletal:no cyanosis of digits and no clubbing  NEURO: alert & oriented x 3 with fluent speech, no focal motor/sensory deficits  LABORATORY DATA:  I have reviewed the data as listed    Component Value Date/Time   NA 141 01/11/2013 0816   NA 138 08/07/2012 0933   K 4.4 01/11/2013 0816  K 4.3 08/07/2012 0933   CL 106 08/07/2012 0933   CL 104 12/22/2011 0815   CO2 25 01/11/2013 0816   CO2 27 08/07/2012 0933   GLUCOSE 85 01/11/2013 0816   GLUCOSE 85 08/07/2012 0933   GLUCOSE 102* 12/22/2011  0815   BUN 18.4 01/11/2013 0816   BUN 22 08/07/2012 0933   CREATININE 1.5* 01/11/2013 0816   CREATININE 1.3 08/07/2012 0933   CALCIUM 9.2 01/11/2013 0816   CALCIUM 9.1 08/07/2012 0933   PROT 6.5 01/11/2013 0816   PROT 6.7 08/07/2012 0933   ALBUMIN 3.8 01/11/2013 0816   ALBUMIN 4.3 08/07/2012 0933   AST 20 01/11/2013 0816   AST 23 08/07/2012 0933   ALT 15 01/11/2013 0816   ALT 20 08/07/2012 0933   ALKPHOS 53 01/11/2013 0816   ALKPHOS 45 08/07/2012 0933   BILITOT 0.63 01/11/2013 0816   BILITOT 1.1 08/07/2012 0933   GFRNONAA 55.95 09/24/2009 0945   GFRAA  Value: 59        The eGFR has been calculated using the MDRD equation. This calculation has not been validated in all clinical situations. eGFR's persistently <60 mL/min signify possible Chronic Kidney Disease.* 11/01/2008 0724    No results found for this basename: SPEP,  UPEP,   kappa and lambda light chains    Lab Results  Component Value Date   WBC 3.2* 01/11/2013   NEUTROABS 1.6 01/11/2013   HGB 13.9 01/11/2013   HCT 41.0 01/11/2013   MCV 94.3 01/11/2013   PLT 164 01/11/2013      Chemistry      Component Value Date/Time   NA 141 01/11/2013 0816   NA 138 08/07/2012 0933   K 4.4 01/11/2013 0816   K 4.3 08/07/2012 0933   CL 106 08/07/2012 0933   CL 104 12/22/2011 0815   CO2 25 01/11/2013 0816   CO2 27 08/07/2012 0933   BUN 18.4 01/11/2013 0816   BUN 22 08/07/2012 0933   CREATININE 1.5* 01/11/2013 0816   CREATININE 1.3 08/07/2012 0933      Component Value Date/Time   CALCIUM 9.2 01/11/2013 0816   CALCIUM 9.1 08/07/2012 0933   ALKPHOS 53 01/11/2013 0816   ALKPHOS 45 08/07/2012 0933   AST 20 01/11/2013 0816   AST 23 08/07/2012 0933   ALT 15 01/11/2013 0816   ALT 20 08/07/2012 0933   BILITOT 0.63 01/11/2013 0816   BILITOT 1.1 08/07/2012 0933      ASSESSMENT: History of squamous cell carcinoma of the right tonsil, HPV positive, no evidence of disease recurrence  PLAN:  #1 squamous cell carcinoma of the right tonsil He has no evidence of disease  recurrence. I recommend the patient to continue seeing [place] here with history, physical examination, and blood work on a yearly basis. The patient informed me that he will be moving out of [place] soon. He will followup with another physician in the near future. The patient had a lot of questions about the natural history of HPV positive squamous cell carcinoma of the head and neck region. I tried, with my best ability to address all his questions. 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 His ANC is still adequate and I do not think he would have problem with recurrent infection. I recommend he have a yearly CBC monitoring in the near future #3 hypothyroidism He would need his thyroid function test monitor and medication adjustment. He will continue followup with his primary  care provider for this #4 preventive care I recommend influenza vaccination today. The patient would like to get his influenza vaccination elsewhere.  All questions were answered. The patient knows to call the clinic with any problems, questions or concerns. We can certainly see the patient much sooner if necessary. No barriers to learning was detected. I spent 15 minutes counseling the patient face to face. The total time spent in the appointment was 20 minutes and more than 50% was on counseling and review of test results     [facility], Juan Supan, MD 01/11/2013 10:43 AM

## 2013-01-16 ENCOUNTER — Ambulatory Visit: Payer: Self-pay | Admitting: Hematology and Oncology

## 2013-01-16 ENCOUNTER — Other Ambulatory Visit: Admitting: Lab

## 2013-02-07 ENCOUNTER — Other Ambulatory Visit: Payer: Self-pay

## 2013-02-22 ENCOUNTER — Encounter: Payer: Self-pay | Admitting: Internal Medicine

## 2013-10-09 ENCOUNTER — Encounter: Payer: Self-pay | Admitting: Internal Medicine

## 2014-01-09 ENCOUNTER — Encounter: Payer: Self-pay | Admitting: Gastroenterology

## 2014-01-17 ENCOUNTER — Other Ambulatory Visit: Payer: Self-pay

## 2014-08-06 ENCOUNTER — Telehealth: Payer: Self-pay | Admitting: *Deleted

## 2014-08-06 NOTE — Telephone Encounter (Signed)
Received request for medical records from Theotis Burrow, [place], [employer], [place], [place]  Phone [phone]  Fax [phone] Authorization to release dental information was included & signed by patient  Request given to Medical Records

## 2014-08-15 ENCOUNTER — Telehealth: Payer: Self-pay | Admitting: *Deleted

## 2014-08-15 NOTE — Telephone Encounter (Signed)
Dosimetry was sent by Mickel Baas to [facility] per request

## 2015-08-28 ENCOUNTER — Telehealth: Payer: Self-pay | Admitting: Hematology and Oncology

## 2015-08-28 NOTE — Telephone Encounter (Signed)
Faxed pt medical records to [facility] [phone]

## 2017-08-06 ENCOUNTER — Encounter: Payer: Self-pay | Admitting: Gastroenterology

## 2017-11-10 ENCOUNTER — Encounter: Payer: Self-pay | Admitting: *Deleted

## 2017-11-10 NOTE — Progress Notes (Signed)
On 11-10-17 fax medical records to [facility] , d.d.s., it was consult note, end of tx note, anticipated ports, sim & planning note

## 2023-10-20 ENCOUNTER — Encounter: Payer: Self-pay | Admitting: Advanced Practice Midwife
